# Patient Record
Sex: Female | Born: 1965 | Race: White | Hispanic: No | Smoking: Current every day smoker
Health system: Southern US, Community
[De-identification: ages and names within clinical notes are randomized; demographics above are authoritative.]

## PROBLEM LIST (undated history)

## (undated) DIAGNOSIS — C3490 Malignant neoplasm of unspecified part of unspecified bronchus or lung: Secondary | ICD-10-CM

## (undated) DIAGNOSIS — F319 Bipolar disorder, unspecified: Secondary | ICD-10-CM

## (undated) HISTORY — PX: LYMPH NODE DISSECTION: SHX5087

## (undated) HISTORY — PX: ABDOMINAL HYSTERECTOMY: SHX81

## (undated) HISTORY — PX: LUNG SURGERY: SHX703

---

## 1998-12-21 ENCOUNTER — Inpatient Hospital Stay (HOSPITAL_COMMUNITY): Admission: EM | Admit: 1998-12-21 | Discharge: 1999-01-01 | Payer: Self-pay | Admitting: Emergency Medicine

## 1999-08-25 ENCOUNTER — Emergency Department (HOSPITAL_COMMUNITY): Admission: EM | Admit: 1999-08-25 | Discharge: 1999-08-25 | Payer: Self-pay | Admitting: Emergency Medicine

## 1999-08-25 ENCOUNTER — Encounter: Payer: Self-pay | Admitting: Emergency Medicine

## 2000-04-08 ENCOUNTER — Encounter: Payer: Self-pay | Admitting: Emergency Medicine

## 2000-04-08 ENCOUNTER — Emergency Department (HOSPITAL_COMMUNITY): Admission: EM | Admit: 2000-04-08 | Discharge: 2000-04-09 | Payer: Self-pay | Admitting: Emergency Medicine

## 2000-04-09 ENCOUNTER — Encounter: Payer: Self-pay | Admitting: Emergency Medicine

## 2000-08-29 ENCOUNTER — Emergency Department (HOSPITAL_COMMUNITY): Admission: EM | Admit: 2000-08-29 | Discharge: 2000-08-29 | Payer: Self-pay | Admitting: Emergency Medicine

## 2000-08-31 ENCOUNTER — Emergency Department (HOSPITAL_COMMUNITY): Admission: EM | Admit: 2000-08-31 | Discharge: 2000-08-31 | Payer: Self-pay | Admitting: Emergency Medicine

## 2000-09-19 ENCOUNTER — Inpatient Hospital Stay (HOSPITAL_COMMUNITY): Admission: EM | Admit: 2000-09-19 | Discharge: 2000-09-22 | Payer: Self-pay

## 2000-09-19 ENCOUNTER — Encounter: Payer: Self-pay | Admitting: Internal Medicine

## 2000-11-24 ENCOUNTER — Encounter: Payer: Self-pay | Admitting: Internal Medicine

## 2000-11-24 ENCOUNTER — Inpatient Hospital Stay: Admission: EM | Admit: 2000-11-24 | Discharge: 2000-11-26 | Payer: Self-pay | Admitting: Emergency Medicine

## 2000-11-24 ENCOUNTER — Encounter: Payer: Self-pay | Admitting: Emergency Medicine

## 2000-11-30 ENCOUNTER — Emergency Department (HOSPITAL_COMMUNITY): Admission: EM | Admit: 2000-11-30 | Discharge: 2000-11-30 | Payer: Self-pay | Admitting: Emergency Medicine

## 2001-08-14 ENCOUNTER — Emergency Department (HOSPITAL_COMMUNITY): Admission: EM | Admit: 2001-08-14 | Discharge: 2001-08-14 | Payer: Self-pay | Admitting: Emergency Medicine

## 2002-08-10 ENCOUNTER — Emergency Department (HOSPITAL_COMMUNITY): Admission: EM | Admit: 2002-08-10 | Discharge: 2002-08-10 | Payer: Self-pay | Admitting: *Deleted

## 2002-08-10 ENCOUNTER — Encounter: Payer: Self-pay | Admitting: Emergency Medicine

## 2002-08-26 ENCOUNTER — Emergency Department (HOSPITAL_COMMUNITY): Admission: EM | Admit: 2002-08-26 | Discharge: 2002-08-26 | Payer: Self-pay | Admitting: Emergency Medicine

## 2002-10-23 ENCOUNTER — Emergency Department (HOSPITAL_COMMUNITY): Admission: EM | Admit: 2002-10-23 | Discharge: 2002-10-23 | Payer: Self-pay | Admitting: Emergency Medicine

## 2002-11-04 ENCOUNTER — Emergency Department (HOSPITAL_COMMUNITY): Admission: EM | Admit: 2002-11-04 | Discharge: 2002-11-04 | Payer: Self-pay | Admitting: Emergency Medicine

## 2003-04-27 ENCOUNTER — Emergency Department (HOSPITAL_COMMUNITY): Admission: EM | Admit: 2003-04-27 | Discharge: 2003-04-27 | Payer: Self-pay | Admitting: Emergency Medicine

## 2003-05-26 ENCOUNTER — Emergency Department (HOSPITAL_COMMUNITY): Admission: EM | Admit: 2003-05-26 | Discharge: 2003-05-26 | Payer: Self-pay | Admitting: Emergency Medicine

## 2003-05-26 ENCOUNTER — Encounter: Payer: Self-pay | Admitting: Emergency Medicine

## 2003-06-25 ENCOUNTER — Emergency Department (HOSPITAL_COMMUNITY): Admission: EM | Admit: 2003-06-25 | Discharge: 2003-06-25 | Payer: Self-pay | Admitting: Emergency Medicine

## 2003-06-25 ENCOUNTER — Encounter: Payer: Self-pay | Admitting: Emergency Medicine

## 2003-07-01 ENCOUNTER — Emergency Department (HOSPITAL_COMMUNITY): Admission: EM | Admit: 2003-07-01 | Discharge: 2003-07-01 | Payer: Self-pay | Admitting: Emergency Medicine

## 2003-10-21 ENCOUNTER — Emergency Department (HOSPITAL_COMMUNITY): Admission: EM | Admit: 2003-10-21 | Discharge: 2003-10-21 | Payer: Self-pay | Admitting: *Deleted

## 2003-12-03 ENCOUNTER — Emergency Department (HOSPITAL_COMMUNITY): Admission: EM | Admit: 2003-12-03 | Discharge: 2003-12-04 | Payer: Self-pay | Admitting: Emergency Medicine

## 2003-12-05 ENCOUNTER — Emergency Department (HOSPITAL_COMMUNITY): Admission: EM | Admit: 2003-12-05 | Discharge: 2003-12-05 | Payer: Self-pay | Admitting: Emergency Medicine

## 2004-01-04 ENCOUNTER — Emergency Department (HOSPITAL_COMMUNITY): Admission: EM | Admit: 2004-01-04 | Discharge: 2004-01-04 | Payer: Self-pay | Admitting: Emergency Medicine

## 2004-01-30 ENCOUNTER — Emergency Department (HOSPITAL_COMMUNITY): Admission: EM | Admit: 2004-01-30 | Discharge: 2004-01-30 | Payer: Self-pay | Admitting: Emergency Medicine

## 2004-02-06 ENCOUNTER — Emergency Department (HOSPITAL_COMMUNITY): Admission: EM | Admit: 2004-02-06 | Discharge: 2004-02-06 | Payer: Self-pay | Admitting: Emergency Medicine

## 2004-03-08 ENCOUNTER — Emergency Department (HOSPITAL_COMMUNITY): Admission: EM | Admit: 2004-03-08 | Discharge: 2004-03-08 | Payer: Self-pay | Admitting: Emergency Medicine

## 2004-04-09 ENCOUNTER — Emergency Department (HOSPITAL_COMMUNITY): Admission: EM | Admit: 2004-04-09 | Discharge: 2004-04-09 | Payer: Self-pay | Admitting: Emergency Medicine

## 2004-05-01 ENCOUNTER — Emergency Department (HOSPITAL_COMMUNITY): Admission: EM | Admit: 2004-05-01 | Discharge: 2004-05-01 | Payer: Self-pay | Admitting: Emergency Medicine

## 2004-07-04 ENCOUNTER — Emergency Department (HOSPITAL_COMMUNITY): Admission: EM | Admit: 2004-07-04 | Discharge: 2004-07-04 | Payer: Self-pay | Admitting: Emergency Medicine

## 2004-07-16 ENCOUNTER — Emergency Department (HOSPITAL_COMMUNITY): Admission: EM | Admit: 2004-07-16 | Discharge: 2004-07-16 | Payer: Self-pay | Admitting: Emergency Medicine

## 2004-09-20 ENCOUNTER — Ambulatory Visit: Payer: Self-pay | Admitting: Psychiatry

## 2004-09-21 ENCOUNTER — Inpatient Hospital Stay (HOSPITAL_COMMUNITY): Admission: RE | Admit: 2004-09-21 | Discharge: 2004-09-29 | Payer: Self-pay | Admitting: Psychiatry

## 2004-10-06 ENCOUNTER — Inpatient Hospital Stay (HOSPITAL_COMMUNITY): Admission: EM | Admit: 2004-10-06 | Discharge: 2004-10-08 | Payer: Self-pay | Admitting: General Surgery

## 2005-02-19 ENCOUNTER — Emergency Department (HOSPITAL_COMMUNITY): Admission: EM | Admit: 2005-02-19 | Discharge: 2005-02-19 | Payer: Self-pay | Admitting: Emergency Medicine

## 2005-05-31 ENCOUNTER — Emergency Department (HOSPITAL_COMMUNITY): Admission: EM | Admit: 2005-05-31 | Discharge: 2005-05-31 | Payer: Self-pay | Admitting: Emergency Medicine

## 2005-07-30 ENCOUNTER — Emergency Department (HOSPITAL_COMMUNITY): Admission: EM | Admit: 2005-07-30 | Discharge: 2005-07-30 | Payer: Self-pay | Admitting: Emergency Medicine

## 2005-10-11 ENCOUNTER — Ambulatory Visit: Payer: Self-pay | Admitting: Psychiatry

## 2005-10-11 ENCOUNTER — Inpatient Hospital Stay (HOSPITAL_COMMUNITY): Admission: EM | Admit: 2005-10-11 | Discharge: 2005-10-22 | Payer: Self-pay | Admitting: Psychiatry

## 2006-07-10 ENCOUNTER — Emergency Department (HOSPITAL_COMMUNITY): Admission: EM | Admit: 2006-07-10 | Discharge: 2006-07-10 | Payer: Self-pay | Admitting: *Deleted

## 2006-09-04 ENCOUNTER — Ambulatory Visit (HOSPITAL_COMMUNITY): Admission: RE | Admit: 2006-09-04 | Discharge: 2006-09-04 | Payer: Self-pay | Admitting: Obstetrics and Gynecology

## 2006-10-05 ENCOUNTER — Emergency Department (HOSPITAL_COMMUNITY): Admission: EM | Admit: 2006-10-05 | Discharge: 2006-10-05 | Payer: Self-pay | Admitting: Emergency Medicine

## 2006-11-08 ENCOUNTER — Encounter (INDEPENDENT_AMBULATORY_CARE_PROVIDER_SITE_OTHER): Payer: Self-pay | Admitting: Specialist

## 2006-11-08 ENCOUNTER — Ambulatory Visit (HOSPITAL_COMMUNITY): Admission: RE | Admit: 2006-11-08 | Discharge: 2006-11-08 | Payer: Self-pay | Admitting: Obstetrics and Gynecology

## 2007-05-18 ENCOUNTER — Emergency Department (HOSPITAL_COMMUNITY): Admission: EM | Admit: 2007-05-18 | Discharge: 2007-05-18 | Payer: Self-pay | Admitting: Emergency Medicine

## 2007-12-11 ENCOUNTER — Emergency Department (HOSPITAL_COMMUNITY): Admission: EM | Admit: 2007-12-11 | Discharge: 2007-12-11 | Payer: Self-pay | Admitting: Emergency Medicine

## 2008-01-02 ENCOUNTER — Emergency Department (HOSPITAL_COMMUNITY): Admission: EM | Admit: 2008-01-02 | Discharge: 2008-01-02 | Payer: Self-pay | Admitting: Emergency Medicine

## 2009-03-13 ENCOUNTER — Emergency Department (HOSPITAL_COMMUNITY): Admission: EM | Admit: 2009-03-13 | Discharge: 2009-03-13 | Payer: Self-pay | Admitting: Emergency Medicine

## 2009-12-31 ENCOUNTER — Emergency Department (HOSPITAL_COMMUNITY): Admission: EM | Admit: 2009-12-31 | Discharge: 2009-12-31 | Payer: Self-pay | Admitting: Emergency Medicine

## 2010-01-12 ENCOUNTER — Emergency Department (HOSPITAL_COMMUNITY): Admission: EM | Admit: 2010-01-12 | Discharge: 2010-01-12 | Payer: Self-pay | Admitting: Emergency Medicine

## 2010-03-10 ENCOUNTER — Emergency Department (HOSPITAL_COMMUNITY): Admission: EM | Admit: 2010-03-10 | Discharge: 2010-03-10 | Payer: Self-pay | Admitting: Emergency Medicine

## 2010-04-09 ENCOUNTER — Emergency Department (HOSPITAL_BASED_OUTPATIENT_CLINIC_OR_DEPARTMENT_OTHER): Admission: EM | Admit: 2010-04-09 | Discharge: 2010-04-09 | Payer: Self-pay | Admitting: Emergency Medicine

## 2010-04-09 ENCOUNTER — Ambulatory Visit: Payer: Self-pay | Admitting: Diagnostic Radiology

## 2010-04-12 ENCOUNTER — Emergency Department (HOSPITAL_COMMUNITY): Admission: EM | Admit: 2010-04-12 | Discharge: 2010-04-12 | Payer: Self-pay | Admitting: Emergency Medicine

## 2010-07-20 ENCOUNTER — Emergency Department (HOSPITAL_BASED_OUTPATIENT_CLINIC_OR_DEPARTMENT_OTHER): Admission: EM | Admit: 2010-07-20 | Discharge: 2010-07-20 | Payer: Self-pay | Admitting: Emergency Medicine

## 2010-08-14 ENCOUNTER — Emergency Department (HOSPITAL_BASED_OUTPATIENT_CLINIC_OR_DEPARTMENT_OTHER): Admission: EM | Admit: 2010-08-14 | Discharge: 2010-08-14 | Payer: Self-pay | Admitting: Emergency Medicine

## 2010-08-14 ENCOUNTER — Ambulatory Visit: Payer: Self-pay | Admitting: Psychiatry

## 2010-08-14 ENCOUNTER — Inpatient Hospital Stay (HOSPITAL_COMMUNITY): Admission: RE | Admit: 2010-08-14 | Discharge: 2010-08-23 | Payer: Self-pay | Admitting: Psychiatry

## 2010-09-13 ENCOUNTER — Emergency Department (HOSPITAL_COMMUNITY): Admission: EM | Admit: 2010-09-13 | Discharge: 2010-09-19 | Payer: Self-pay | Admitting: Emergency Medicine

## 2010-09-14 DIAGNOSIS — IMO0002 Reserved for concepts with insufficient information to code with codable children: Secondary | ICD-10-CM

## 2010-09-16 DIAGNOSIS — F1910 Other psychoactive substance abuse, uncomplicated: Secondary | ICD-10-CM

## 2010-09-18 DIAGNOSIS — F331 Major depressive disorder, recurrent, moderate: Secondary | ICD-10-CM

## 2010-09-19 ENCOUNTER — Ambulatory Visit: Payer: Self-pay | Admitting: Psychiatry

## 2010-10-14 ENCOUNTER — Inpatient Hospital Stay (HOSPITAL_COMMUNITY): Admission: AD | Admit: 2010-10-14 | Discharge: 2010-09-28 | Payer: Self-pay | Admitting: Psychiatry

## 2010-11-24 ENCOUNTER — Emergency Department (HOSPITAL_BASED_OUTPATIENT_CLINIC_OR_DEPARTMENT_OTHER)
Admission: EM | Admit: 2010-11-24 | Discharge: 2010-11-24 | Payer: Self-pay | Source: Home / Self Care | Admitting: Emergency Medicine

## 2010-11-25 ENCOUNTER — Emergency Department (HOSPITAL_COMMUNITY)
Admission: EM | Admit: 2010-11-25 | Discharge: 2010-11-26 | Disposition: A | Discharge status: Other Acute Inpatient Hospital | Payer: Self-pay | Source: Home / Self Care | Admitting: Emergency Medicine

## 2010-11-26 ENCOUNTER — Inpatient Hospital Stay (HOSPITAL_COMMUNITY)
Admission: AD | Admit: 2010-11-26 | Discharge: 2010-12-03 | Payer: Self-pay | Source: Home / Self Care | Attending: Psychiatry | Admitting: Psychiatry

## 2010-11-27 ENCOUNTER — Encounter: Payer: Self-pay | Admitting: Obstetrics and Gynecology

## 2010-11-29 LAB — COMPREHENSIVE METABOLIC PANEL
ALT: 26 U/L (ref 0–35)
ALT: 20 U/L (ref 0–35)
Albumin: 3.9 g/dL (ref 3.5–5.2)
BUN: 20 mg/dL (ref 6–23)
BUN: 8 mg/dL (ref 6–23)
CO2: 24 mEq/L (ref 19–32)
Calcium: 9.4 mg/dL (ref 8.4–10.5)
Calcium: 9.2 mg/dL (ref 8.4–10.5)
Creatinine, Ser: 0.85 mg/dL (ref 0.4–1.2)
GFR calc non Af Amer: >60 mL/min (ref >60)
Glucose, Bld: 90 mg/dL (ref 70–99)
Glucose, Bld: 92 mg/dL (ref 70–99)
Potassium: 4.5 mEq/L (ref 3.5–5.1)
Sodium: 143 mEq/L (ref 135–145)
Sodium: 137 mEq/L (ref 135–145)
Total Protein: 7.4 g/dL (ref 6.0–8.3)

## 2010-11-29 LAB — DIFFERENTIAL
Basophils Absolute: 0 10*3/uL (ref 0.0–0.1)
Basophils Relative: 0 % (ref 0–1)
Eosinophils Absolute: 0.2 10*3/uL (ref 0.0–0.7)
Eosinophils Relative: 2 % (ref 0–5)
Eosinophils Relative: 1 % (ref 0–5)
Lymphocytes Relative: 34 % (ref 12–46)
Lymphocytes Relative: 37 % (ref 12–46)
Lymphs Abs: 3.3 10*3/uL (ref 0.7–4.0)
Monocytes Absolute: 0.9 10*3/uL (ref 0.1–1.0)
Monocytes Absolute: 0.6 10*3/uL (ref 0.1–1.0)
Monocytes Relative: 7 % (ref 3–12)

## 2010-11-29 LAB — CBC
HCT: 42.1 % (ref 36.0–46.0)
HCT: 46.4 % — ABNORMAL HIGH (ref 36.0–46.0)
MCH: 32.3 pg (ref 26.0–34.0)
MCHC: 34.9 g/dL (ref 30.0–36.0)
MCHC: 34.7 g/dL (ref 30.0–36.0)
MCV: 93 fL (ref 78.0–100.0)
Platelets: 280 10*3/uL (ref 150–400)
RDW: 12.7 % (ref 11.5–15.5)
RDW: 12.7 % (ref 11.5–15.5)
WBC: 9.8 10*3/uL (ref 4.0–10.5)

## 2010-11-29 LAB — URINALYSIS, ROUTINE W REFLEX MICROSCOPIC
Bilirubin Urine: NEGATIVE
Bilirubin Urine: NEGATIVE
Hgb urine dipstick: NEGATIVE
Ketones, ur: NEGATIVE mg/dL
Ketones, ur: NEGATIVE mg/dL
Nitrite: NEGATIVE
Protein, ur: NEGATIVE mg/dL
Protein, ur: NEGATIVE mg/dL
Specific Gravity, Urine: 1.005 (ref 1.005–1.030)
Urine Glucose, Fasting: NEGATIVE mg/dL
Urine Glucose, Fasting: NEGATIVE mg/dL
Urobilinogen, UA: 0.2 mg/dL (ref 0.0–1.0)
pH: 5.5 (ref 5.0–8.0)

## 2010-11-29 LAB — ETHANOL: Alcohol, Ethyl (B): <5 mg/dL (ref 0–10)

## 2010-11-29 LAB — RAPID URINE DRUG SCREEN, HOSP PERFORMED
Benzodiazepines: POSITIVE — AB
Cocaine: NOT DETECTED
Opiates: POSITIVE — AB

## 2010-11-29 LAB — PREGNANCY, URINE: Preg Test, Ur: NEGATIVE

## 2010-11-29 LAB — LIPASE, BLOOD: Lipase: 144 U/L (ref 23–300)

## 2010-11-29 LAB — POCT PREGNANCY, URINE: Preg Test, Ur: NEGATIVE

## 2010-11-29 LAB — SALICYLATE LEVEL: Salicylate Lvl: <4 mg/dL (ref 2.8–20.0)
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
## 2011-01-18 LAB — BASIC METABOLIC PANEL
CO2: 25 mEq/L (ref 19–32)
Calcium: 9.7 mg/dL (ref 8.4–10.5)
GFR calc Af Amer: >60 mL/min (ref >60)
GFR calc non Af Amer: >60 mL/min (ref >60)
Sodium: 138 mEq/L (ref 135–145)

## 2011-01-18 LAB — DIFFERENTIAL
Lymphs Abs: 2.9 10*3/uL (ref 0.7–4.0)
Monocytes Relative: 6 % (ref 3–12)
Neutro Abs: 6.9 10*3/uL (ref 1.7–7.7)
Neutrophils Relative %: 66 % (ref 43–77)

## 2011-01-18 LAB — RAPID URINE DRUG SCREEN, HOSP PERFORMED
Opiates: NOT DETECTED
Tetrahydrocannabinol: NOT DETECTED

## 2011-01-18 LAB — CBC
Hemoglobin: 16.5 g/dL — ABNORMAL HIGH (ref 12.0–15.0)
RBC: 5.09 MIL/uL (ref 3.87–5.11)

## 2011-01-18 LAB — TRICYCLICS SCREEN, URINE: TCA Scrn: POSITIVE — AB

## 2011-01-18 LAB — POTASSIUM: Potassium: 3.6 mEq/L (ref 3.5–5.1)

## 2011-01-19 LAB — BASIC METABOLIC PANEL
Chloride: 102 mEq/L (ref 96–112)
GFR calc Af Amer: >60 mL/min (ref >60)
Potassium: 4 mEq/L (ref 3.5–5.1)

## 2011-01-20 LAB — CBC
Hemoglobin: 14.2 g/dL (ref 12.0–15.0)
MCHC: 33.6 g/dL (ref 30.0–36.0)
Platelets: 251 10*3/uL (ref 150–400)
RBC: 4.45 MIL/uL (ref 3.87–5.11)

## 2011-01-20 LAB — POCT TOXICOLOGY PANEL
Amphetamines: POSITIVE
Benzodiazepines: POSITIVE
Cocaine: POSITIVE

## 2011-01-20 LAB — COMPREHENSIVE METABOLIC PANEL
Albumin: 4 g/dL (ref 3.5–5.2)
Alkaline Phosphatase: 97 U/L (ref 39–117)
BUN: 11 mg/dL (ref 6–23)
Calcium: 9.1 mg/dL (ref 8.4–10.5)
Glucose, Bld: 108 mg/dL — ABNORMAL HIGH (ref 70–99)
Potassium: 3.9 mEq/L (ref 3.5–5.1)
Total Protein: 7.7 g/dL (ref 6.0–8.3)

## 2011-01-20 LAB — DIFFERENTIAL
Basophils Relative: 0 % (ref 0–1)
Lymphocytes Relative: 35 % (ref 12–46)
Lymphs Abs: 2.4 10*3/uL (ref 0.7–4.0)
Monocytes Absolute: 0.5 10*3/uL (ref 0.1–1.0)
Monocytes Relative: 8 % (ref 3–12)
Neutro Abs: 3.8 10*3/uL (ref 1.7–7.7)
Neutrophils Relative %: 53 % (ref 43–77)

## 2011-01-20 LAB — URINALYSIS, ROUTINE W REFLEX MICROSCOPIC
Glucose, UA: NEGATIVE mg/dL
Protein, ur: NEGATIVE mg/dL
pH: 5 (ref 5.0–8.0)

## 2011-01-20 LAB — ETHANOL: Alcohol, Ethyl (B): <5 mg/dL (ref 0–10)

## 2011-01-20 LAB — POCT CARDIAC MARKERS: Myoglobin, poc: 65.6 ng/mL (ref 12–200)

## 2011-01-24 LAB — URINE MICROSCOPIC-ADD ON

## 2011-01-24 LAB — URINALYSIS, ROUTINE W REFLEX MICROSCOPIC
Glucose, UA: NEGATIVE mg/dL
Glucose, UA: NEGATIVE mg/dL
Leukocytes, UA: NEGATIVE
Protein, ur: NEGATIVE mg/dL
Protein, ur: 30 mg/dL — AB
Specific Gravity, Urine: 1.018 (ref 1.005–1.030)
Specific Gravity, Urine: 1.021 (ref 1.005–1.030)
Urobilinogen, UA: 1 mg/dL (ref 0.0–1.0)

## 2011-01-24 LAB — POCT TOXICOLOGY PANEL: Amphetamines: POSITIVE

## 2011-01-24 LAB — URINE CULTURE
Colony Count: NO GROWTH
Colony Count: NO GROWTH
Culture: NO GROWTH
Culture: NO GROWTH

## 2011-01-25 LAB — URINE MICROSCOPIC-ADD ON

## 2011-01-25 LAB — RAPID URINE DRUG SCREEN, HOSP PERFORMED
Barbiturates: NOT DETECTED
Opiates: NOT DETECTED
Tetrahydrocannabinol: POSITIVE — AB

## 2011-01-25 LAB — URINALYSIS, ROUTINE W REFLEX MICROSCOPIC
Glucose, UA: NEGATIVE mg/dL
Leukocytes, UA: NEGATIVE
Protein, ur: NEGATIVE mg/dL
Specific Gravity, Urine: 1.021 (ref 1.005–1.030)
pH: 7 (ref 5.0–8.0)

## 2011-01-25 LAB — COMPREHENSIVE METABOLIC PANEL
Albumin: 3.6 g/dL (ref 3.5–5.2)
Alkaline Phosphatase: 71 U/L (ref 39–117)
BUN: 7 mg/dL (ref 6–23)
CO2: 30 mEq/L (ref 19–32)
Chloride: 102 mEq/L (ref 96–112)
Potassium: 3.1 mEq/L — ABNORMAL LOW (ref 3.5–5.1)
Total Bilirubin: 0.7 mg/dL (ref 0.3–1.2)

## 2011-02-15 LAB — URINALYSIS, ROUTINE W REFLEX MICROSCOPIC
Glucose, UA: NEGATIVE mg/dL
Hgb urine dipstick: NEGATIVE
Ketones, ur: >80 mg/dL — AB
Protein, ur: NEGATIVE mg/dL

## 2011-02-15 LAB — CBC
HCT: 47.5 % — ABNORMAL HIGH (ref 36.0–46.0)
MCV: 94.5 fL (ref 78.0–100.0)
Platelets: 273 10*3/uL (ref 150–400)
RDW: 12.6 % (ref 11.5–15.5)

## 2011-02-15 LAB — COMPREHENSIVE METABOLIC PANEL
Albumin: 4 g/dL (ref 3.5–5.2)
BUN: 12 mg/dL (ref 6–23)
Calcium: 9.4 mg/dL (ref 8.4–10.5)
Creatinine, Ser: 0.85 mg/dL (ref 0.4–1.2)
Total Protein: 7.5 g/dL (ref 6.0–8.3)

## 2011-02-15 LAB — DIFFERENTIAL
Lymphocytes Relative: 9 % — ABNORMAL LOW (ref 12–46)
Monocytes Absolute: 0.7 10*3/uL (ref 0.1–1.0)
Monocytes Relative: 5 % (ref 3–12)
Neutro Abs: 13.7 10*3/uL — ABNORMAL HIGH (ref 1.7–7.7)
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
## 2011-03-25 NOTE — Discharge Summary (Signed)
NAMESHARILYNN, [name]           ACCOUNT NO.:  [id number]   MEDICAL RECORD NO.:  [id number]          PATIENT TYPE:  INP   LOCATION:  0379                         FACILITY[facility]   PHYSICIAN:  Sharlet Salina T. Hoxworth, M.D.DATE OF BIRTH:  1966-01-11   DATE OF ADMISSION:  10/06/2004  DATE OF DISCHARGE:  10/08/2004                                 DISCHARGE SUMMARY   DISCHARGE DIAGNOSES:  Complex abscess posterior neck.   SURGICAL PROCEDURE:  I&D and debridement complex abscess posterior neck  October 06, 2004.   HISTORY OF PRESENT ILLNESS:  The patient is a 45 year old female with an  approximately 5-6 day history of worsening area of pain, redness and  swelling on her posterior neck.  She was evaluated in our office today after  referral and then admitted for further surgical treatment.  No history of  similar illnesses.   PAST MEDICAL HISTORY:  Surgery includes TAH, breast biopsy, bilateral tubal  ligation.  She is treated for anxiety and depression.   MEDICATIONS:  Tegretol, Cymbalta, Neurontin, Klonopin, Protonix.   ALLERGIES:  She is allergic to PENICILLIN and TORADOL.   Social history, family history and review of systems see admission H&P.   PHYSICAL EXAMINATION:  VITAL SIGNS:  Temperature is 101.5, pulse 98.  Remainder of vital signs normal.   Pertinent exam was limited to soft tissue of the neck which showed a large  approximately 6-7 cm area of induration, erythema and swelling in the  posterior neck with some central skin necrosis.   HOSPITAL COURSE:  The patient was admitted. She was started empirically on  IV vancomycin. She underwent incision, drainage and debridement of a large  complex abscess in the posterior neck.  The first postoperative day, her  white count was 14,000. She still had low grade fever. She was treated with  frequent moist saline dressing changes.  Gram stain revealed Gram positive  cocci in clusters. By December 2, she was afebrile, felt  significantly  better.  Her wound was cleaning up nicely. She was discharged at this time.  Cultures are pending.  She was discharged on p.o. doxicycline 100 mg twice  daily for the next week.  Followup is to be in our office in 2-3 days.  Home  health will assist with wound care.     Benj   BTH/MEDQ  D:  10/26/2004  T:  10/27/2004  Job:  [id number]

## 2011-03-25 NOTE — Discharge Summary (Signed)
[facility]  Patient:    [name], [name]                  MRN: [id number] Adm. Date:  [id number] Disc. Date: [id number] Attending:  Sharlet Salina CC:         Delton Coombes, M.D. [facility] Dept of Mental Health [address] [place], [place] [address]             [address]  [facility]   Discharge Summary  FINAL DIAGNOSES: 1. Dystonic reaction secondary to Paxil. 2. TMJ syndrome. 3. Bronchitis. 4. Gingivitis. 5. Bipolar disorder. 6. Anxiety disorder. 7. History of crack addiction.  HISTORY OF PRESENT ILLNESS:  This 45 year old white female presented to [facility] Emergency Department on the morning of November 24, 2000, via ambulance, with history of having onset the evening prior of episodes in which her jaws would actually lock down, and she would have some problems swallowing with inability to speak.  The patient was complaining bitterly of right ear pain.  She has had a recent URI, and is bringing up discolored sputum.  She is a heavy smoker.  Apparently, she had a couple of episodes, one on the evening of November 23, 2000, and again on the morning of November 24, 2000.  She has a long-standing history of bipolar disorder and anxiety disorder, and is maintained on Neurontin 900 mg daily and Paxil 40 mg daily per physicians at mental health.  She also has a history of crack addiction, and was kicked out of the [facility] approximately two weeks ago for relapsing.  She is now back at [facility].  Her drug screen here was positive only for benzodiazepine Clonopin) and morphine sulfate (opiate).  She had received morphine in the emergency department for jaw pain.  She did actually have an episode in the emergency department late morning in which she had basically a full blown dystonic reaction with her neck arching backwards and jaws clenching down.  She salivated.  She really was unable to speak, and this episode lasted some 20 to 30  minutes.  It was treated with Toradol IV and IV Benadryl by emergency department physician.  Subsequently, she received several doses of morphine sulfate for pain.  She had no evidence of ear infection.  I think the ear pain is clearly related to a sore right TM joint. A panorex view was obtained of the TM joint by the emergency department physician and it was unremarkable.  I obtained a chest x-ray showing what is believed to be some atelectasis and no frank infiltrate.  She did have a slight elevation of SGPT which in retrospect could have been due to a muscle component with enzyme released into the blood stream with this dystonic reaction.  Several labs are pending at the time of discharge, including a hepatitis panel.  She has a history of a "liver infection" many years ago, it is not clear to me what the exact diagnosis was.  She denies IV drug abuse. She has been hospitalized a couple of times at [facility], most recently in November 2001, after a suicidal gesture.  She had cocaine in her system at that time according to records at [facility].  She says that she has no transportation and no money to buy her medications.  She recently started living at [facility], and apparently has to get a job immediately in order to pay the $85.00 a week required for her to stay there.  Formerly worked at [employer] until she had a relapse of crack addiction.  In any case, she had no further recurrence of full blown dystonic reactions while in the hospital.  The day following admission she was noted to be doing pretty well, but complained of feeling "ancy."  I think she received an additional dose of Clonopin per on call physician, but basically explained to her that we were switching her from Paxil to Celexa and that she might feel some lack of sedation because of that.  We have an extensive taper for her to do gradually moving off of Paxil and on to Celexa over the next week.  We have arranged  for her to be seen at [facility] on Tuesday at 9 a.m. at [facility].  She was encouraged to follow up with [facility] for her medical care, and to contact [facility] to see if she qualifies for any type of Medicaid to cover this hospitalization.  I have obtained from my office samples of Levaquin 500 mg q.d. x 7 days to treat her bronchitis, Advair inhaler 250/50 2 sprays p.o. q.12h. x 2 weeks.  She has a Ventolin inhaler which was ordered here at the hospital, 2 sprays p.o. q.i.d. p.r.n.  She is to continue with Neurontin 900 mg at bedtime, Clonopin 1 mg q.i.d.  She will be tapered off of Paxil as follows:  On November 27, 2000, she will receive 20 mg of Paxil, on Tuesday, 10 mg of Paxil, Wednesday, November 29, 2000, 10 mg of Paxil, Thursday, November 30, 2000, 10 mg of Paxil, and Friday, December 01, 2000, no Paxil.  Celexa will be given as follows:  Monday, November 27, 2000, she will receive 20 mg of Celexa, on Tuesday, November 28, 2000, 30 mg of Celexa, Wednesday, November 29, 2000, 30 mg of Celexa which will be continued until Friday, December 01, 2000, when she will increase to 40 mg of Celexa.  I have also provided Celexa samples for one week from my office.  She also asked for samples or hospital provided ibuprofen for the TM joint pain.  She was told by hospital staff that none of that was available for take home.  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  So, she does have refills on Clonopin prescription. Suggested that she try to find money to buy just a few doses to last until Tuesday when she could be seen at mental health.  She does have money for cigarettes apparently, or at least someone provides cigarettes for her, and she smokes heavily a pack a day.  She was advised to take Extra Strength Tylenol in between doses of ibuprofen for the TM joint pain, and to use a moist heating pad or  hot washcloth to the right TM joint.  Explained to her  that this should get better over the next week or so, and not to eat any foods such as steak, which would aggravate the TM joint, and not to chew gum. Explained to her that TM joint pain was usually very manageable and was basically an aggravation, and not expected to be a significant problem. Yesterday she had asked about an injection of Ativan because she was ancy, and I told her that I did not think that was appropriate.  She exhibits some manipulative type behavior and some questionable drug seeking behavior.  She does have a history of prescription drug abuse in  the past, in addition to her crack habit.  For other details of her history, please see dictated history and physical.  Therefore, the patient is being discharged today in stable condition accompanied by her sister.  The patient says she plans to return immediately to [facility]. DD:  11/26/00 TD:  11/27/00 Job: [id number]

## 2011-03-25 NOTE — Discharge Summary (Signed)
[place]  Patient:    [name], [name]                  MRN: [id number] Adm. Date:  [id number] Disc. Date: [id number] Attending:  Edwyna Perfect Dictator:   Felton Clinton, M.D. CC:         Adelene Amas. Hall, M.D., [place], [place]   Discharge Summary  DISCHARGE DIAGNOSES: 1. Drug overdose on Neurontin and Klonopin.  Apparent suicide attempt. 2. Bipolar disorder/depression. 3. Polysubstance abuse including alcohol and cocaine. 4. Anxiety disorder.  DISCHARGE MEDICATIONS: 1. Neurontin 300 mg p.o. t.i.d. 2. Paxil 40 mg p.o. q.d.  DISCHARGE FOLLOWUP:  Arrangements were made for [name] to be transferred to the [facility].  She will be treated there for her cocaine and alcohol abuse.  In addition, arrangements will be made for her to be detoxed from her chronic benzodiazepine use.  Upon her discharge from [facility] she will follow up with Dr. Gwyndolyn Kaufman at [facility].  CONSULTATIONS:  Dr. Jeanie Sewer, psychiatry.  PROCEDURES:  None.  HISTORY OF PRESENT ILLNESS:  [name] is a 45 year old white female with bipolar disorder diagnosed in 1995; polysubstance abuse including benzodiazepine, crack cocaine and OxyContin; and anxiety disorder who presents to the emergency department secondary to an intentional drug overdose.  This overdose consisted of 30 Neurontin tablets, 10 Klonopin tablets, and one Paxil taken at approximately 7:30 in the morning of admission.  The patient states that the overdose was intentional and had been planned.  She had called relatives including her 61 and 63 year old daughters to say good-bye.  She has recently completed an eight-week rehabilitation as an outpatient in [place] for alcohol abuse.  She recently moved back to [place] and has been living here with her sister.  Prior to her suicide attempt, her sister informed the patient that she would have to move out  of her current living arrangements, which precipitated this crisis.  She has been seeing Dr. Vilinda Blanks at [facility] for approximately one month.  ADMISSION LABORATORY DATA:  White blood cell count 8.9, hemoglobin 14.3, platelets 337, MCV 94.2.  Sodium 139, potassium 3.7, chloride 108, bicarb 29, BUN 10, creatinine 0.8, glucose 87, calcium 8.4, total bilirubin, 0.2, total protein 6.9, albumin 3.3, AST 16, ALT 13, alkaline phosphatase 83.  Urinalysis showed specific gravity of 1.009, small leukocyte esterase, white blood cells 21-50.  Urine drug screen was positive for benzodiazepines and cocaine.  HOSPITAL COURSE: #1 - BIPOLAR DISORDER/DEPRESSION:  [name] was stable upon her admission to the hospital and was admitted to a regular bed.  A sitter was provided to be with the patient at all times.  She remained somewhat agitated throughout her hospital stay, requiring IV Haldol for sedation several times.  Her Neurontin and Paxil were initially held secondary to her overdose, but were restarted on the day prior to discharge.  A psychiatry consult with Dr. Jeanie Sewer was obtained.  Dr. Jeanie Sewer recommended inpatient psychiatric treatment for intensive therapy.  Initially, [name] declined.  Placement was a problem as she had no insurance.  Arrangements were made for her to be transferred to [facility], and she seemed comfortable with this situation.  Given her chronic history of benzodiazepine use, it was felt that a supervised detox center would be an appropriate place for disposition.  #2 - URINARY TRACT INFECTION:  [name] was found to have a urinary tract infection on admission.  She was  treated with Cipro.  DISCHARGE LABORATORIES:  White blood cell count 9.4, hemoglobin 13.6, platelets 320, sodium 137, potassium 3.5, chloride 107, bicarb 25, glucose 78, BUN 8, creatinine 0.6, calcium 8.4. DD:  10/19/00 TD:  10/20/00 Job: [id number]

## 2011-03-25 NOTE — H&P (Signed)
NAME:  [name], [name]           ACCOUNT NO.:  [id number]   MEDICAL RECORD NO.:  [id number]          PATIENT TYPE:  AMB   LOCATION:  SDC                           FACILITY:  [facility]   PHYSICIAN:  Sherron Monday, MD        DATE OF BIRTH:  [birth year]   DATE OF ADMISSION:  DATE OF DISCHARGE:                              HISTORY & PHYSICAL   She will be undergoing an operative laparoscopy, possible fulguration of  endometriosis, bilateral salpingectomy, unilateral and possible  bilateral oophorectomy.   DIAGNOSIS:  Pelvic pain, questionable endometriosis.   HISTORY OF PRESENT ILLNESS:  [name] is a 45 year old G3, P2-0-1-2  who presents initially in October with a right ovarian cyst that she  states is very painful.  She describes it as 9/10 and constant, right-  sided pain.  The right ovarian cyst was measured 4.7 x 4.2 x 4.5 on  September 3.  She has had minimal vaginal bleeding for the last several  days prior to her being seen in the office.  She also complained of a  cough and a low-grade temperature.   PAST MEDICAL HISTORY:  Significant for bipolar disorder, narcotic  addiction.   PAST SURGICAL HISTORY:  Significant for bilateral tubal ligation,  vaginal hysterectomy, breast lumpectomy, cyst removal from her neck and  back.   PAST OBSTETRICAL AND GYNECOLOGICAL HISTORY:  She is a G3, P2-0-1-2 with  a miscarriage with a D&C and two term vaginal deliveries of female  infants without complications.  She states she has had an abnormal Pap  in the past; they have been normal since then.  Her last was December  2006.  She has a history remotely of chlamydia.  Her menarche was at age  59.  She always had irregular, heavy cycles and lots of pain.  She says  she is sexually active with her ex-husband and has had dyspareunia since  this diagnosis of the cyst.  She does have some history of occasional  postcoital bleeding prior to her hysterectomy.   MEDICATIONS:  Include Percocet and  Xanax at the time she was originally  seen.  Currently she states she is taking Suboxone.   ALLERGIES:  To PENICILLIN, TORADOL, and PAXIL which all caused itching.   SOCIAL HISTORY:  She admits to smoking a half a pack of cigarettes a  day.  No alcohol or drugs.  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   FAMILY HISTORY:  Significant for hypertension in her mother,  hypercholesterolemia in the mother.  No coronary artery disease, no  diabetes.  Cancer in her mother which she says was colon, breast, and  skin cancer.   PHYSICAL EXAMINATION:  Transvaginal ultrasound revealed a small ovarian  cyst that was simple.  She was evaluated for a questionable history of  kidney stones and questionable exacerbation.  She had a formal pelvic  ultrasound which revealed a right ovarian cyst, as well as left ovarian  cyst which had a solid cyst, a hydrosalpinx as well.  Her cul-de-sac was  noted to be without fluid.  The formal ultrasound was performed after  a  small cyst was noted on her ovary by CT.   She presented to the office on numerous occasions or calling requesting  pain medication, which she was given naproxen and Ponstel.  She  requested on numerous occasions Vicodin and Darvocet.  This was  confirmed with her family practice doctor that she was frequently  requesting these medications.  After the ultrasound revealed a solid  cyst on her ovary a CA-125 was checked and found to be 13.2.  We then  discussed with the patient the operative laparoscopy, fulguration of  endometriosis, unilateral or bilateral oophorectomy, bilateral  salpingectomy, and the patient voiced understanding and wished to  proceed.  She was also given a narcotic contract and #15 of Vicodin.  The patient presented for her preoperative appointment and gave the name  of her psychiatrist who she stated she is now on Suboxone from and  wanted [place] to call for pain plan with him.  There has been a message left  with him.  We  will proceed with surgery.   ASSESSMENT AND PLAN:  A 45 year old gravida 3, para 2 with bilateral  hydrosalpinx, solid-appearing cyst on the left ovary, as well as pelvic  pain.  She will have an operative laparoscopy, fulguration of  endometriosis, bilateral salpingectomy, unilateral versus bilateral  oophorectomy.  We will proceed with the surgery on November 08, 2006.      Sherron Monday, MD  Electronically Signed     JB/MEDQ  D:  11/06/2006  T:  11/06/2006  Job:  [id number]

## 2011-03-25 NOTE — Op Note (Signed)
[name], [name]           ACCOUNT NO.:  [id number]   MEDICAL RECORD NO.:  [id number]          PATIENT TYPE:  AMB   LOCATION:  SDC                           [facility]ACILITY:  [facility]   PHYSICIAN:  Sherron Monday, MD        DATE OF BIRTH:  Jul 06, 1966   DATE OF PROCEDURE:  11/08/2006  DATE OF DISCHARGE:                               OPERATIVE REPORT   PREOPERATIVE DIAGNOSES:  Pelvic pain, solid left ovarian cyst, right  simple cyst, bilateral hydrosalpinx.   POSTOPERATIVE DIAGNOSES:  Pelvic pain, solid left ovarian cyst, right  simple cyst, bilateral hydrosalpinx.   PROCEDURE:  Operative laparoscopy, left salpingo-oophorectomy, right  salpingectomy, fulguration of endometriosis.    Surgeon: Sherron Monday, M.D/   ASSISTANT:  Zenaida Niece, M.D.   ANESTHESIA:  General with 10 mL of local.   SPECIMENS:  Left tube and ovary, right tube.   EBL:  Minimal.   IV FLUIDS:  1200 mL   URINE OUTPUT:  Fifty mL by I&O counts prior to the procedure.   FINDINGS:  Minimal endometriosis at the vaginal cuff, small left  hydrosalpinx, right cyst of Morgagni, normal liver edge.   COMPLICATIONS:  None.   DISPOSITION:  Stable to PACU following the procedure.   PROCEDURE:  After informed consent was reviewed with the patient,  including the risks, benefits and alternatives of the surgical  procedure, she was transferred to the OR and placed on the table in the  supine position.  General anesthesia was induced and found to be  adequate.  She was then placed in the yellow fin stirrups, prepped and  draped in the normal sterile fashion.  A sponge stick was placed in her  vagina after her bladder had been drained for manipulation of her  vaginal cuff.  A 5 mm infraumbilical incision was made and using a  Veress needle the peritoneum was entered.  This was tested with several  tests of saline and pneumoperitoneum was obtained and using direct entry  a 5 mm port was placed without difficulty.  A brief  pelvic survey was  performed.  A 5 mm right-sided accessory port was placed after  visualization of the umbilical arteries.  Using a blunt trocar the  survey was again performed revealing minimal area of endometriosis on  the right side of the cuff as well as left hydrosalpinx as well as a  clear posterior cul-de-sac, and normal liver edge.  The appendix was  unable to be visualized and thought to be retrocecal.  A 10 mm left-  sided  port was placed.  Using the Gyrus the left tube and ovary was  removed.  Hemostasis was assured using.  Using the Gyrus the right tube  and cyst of Morgagni were removed without difficulty.  Hemostasis was  again assured.  Using the Endo Catch these were removed from the abdomen  without difficulty.  The pedicles were again inspected.  Hemostasis was  assured.  The ports were removed under direct visualization.  The gas  was evacuated from the peritoneal cavity.  The 10 mm left-sided  port  had a  deep suture of 4-0 placed and the skin was closed with 4-0 Vicryl.  The two, 5 mm ports were closed with a subcuticular 4-0 and Dermabond  was applied to all the sites.  The sponge stick was removed from the  vagina.  The patient was awakened in stable condition.  Sponge, lap and  needle counts were correct x2 per the operating staff.  She was taken in  stable condition to the PACU following the procedure.      Sherron Monday, MD  Electronically Signed     JB/MEDQ  D:  11/08/2006  T:  11/08/2006  Job:  [id number]

## 2011-03-25 NOTE — Op Note (Signed)
Hall, Kelsey           ACCOUNT NO.:  [id number]   MEDICAL RECORD NO.:  [id number]          PATIENT TYPE:  AMB   LOCATION:  DAY                          [facility]ACILITY:  [facility]   PHYSICIAN:  Sharlet Salina T. Hoxworth, M.D.DATE OF BIRTH:  [birth year]   DATE OF PROCEDURE:  10/06/2004  DATE OF DISCHARGE:                                 OPERATIVE REPORT   PREOPERATIVE DIAGNOSIS:  Soft tissue infection, posterior neck.   POSTOPERATIVE DIAGNOSIS:  Soft tissue infection, posterior neck.   SURGICAL PROCEDURE:  Incision, drainage, and debridement (skin, subcu, and  fascia) soft tissue infection posterior neck.   SURGEON:  Dr. Johna Sheriff   ANESTHESIA:  General endotracheal.   BRIEF HISTORY:  [name] is a 45 year old female, who presents with  several days of worsening pain, redness, and swelling posterior base of her  neck.  Examination reveals a significant soft tissue infection with an  approximately 10 x 5 cm transversely-oriented area of induration and  erythema over about the C7 vertebra at the posterior neck.  There is a  central area of slight skin necrosis measuring a centimeter or so in  diameter and multiple tiny draining pustules around this.  It is exquisitely  tender.  Incision, drainage, and debridement has been recommended and  accepted.  The nature of the procedure, its indications and risks have been  discussed and understood.  She has received preoperative antibiotics.  She  is brought to the operating room for this procedure.   DESCRIPTION OF OPERATION:  The patient brought to the operating room, placed  in supine position on the operating table, and general endotracheal  anesthesia was induced.  She was positioned laterally, carefully padded, and  the posterior neck sterilely prepped and draped.  I initially performed an  elliptical excision of the obviously necrotic skin and area of pustules, and  this was carried down deep into the subcutaneous tissue.  There was a  fairly  small, discrete abscess cavity, but purulence was essentially infiltrating  throughout the subcutaneous tissue down deep to the superficial fascia with  areas of necrosis.  I then extended the incision transversely a centimeter  or two in each direction and did a fairly extensive debridement of  subcutaneous tissue that was obviously necrotic with pus infiltrating  through the tissue and also a portion of the underlying fascia as well.  This was taken back until the tissue appeared viable and could not express  pus with pressure.  This left a transverse incision about 6 cm in length,  1.5 cm in width which was left open, and the wound was approximately 2-3 cm  deep.  Hemostasis was obtained with cautery.  The soft tissue was  infiltrated with Marcaine without epinephrine.  Hemostasis was assured.  The  wound was packed with moist Betadine gauze.  Dry sterile dressing was  applied.  She was taken to the recovery room in satisfactory condition.     Benj   BTH/MEDQ  D:  10/06/2004  T:  10/06/2004  Job:  [id number]
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
## 2011-03-25 NOTE — Discharge Summary (Signed)
NAMEMLISS, WEDIN NO.:  [id number]   MEDICAL RECORD NO.:  [id number]          PATIENT TYPE:  IPS   LOCATION:  0302                          FACILITY:  [facility]   PHYSICIAN:  Jeanice Lim, M.D. DATE OF BIRTH:  1966/01/10   DATE OF ADMISSION:  09/21/2004  DATE OF DISCHARGE:  09/29/2004                                 DISCHARGE SUMMARY   IDENTIFYING DATA:  This is a 45 year old, separated, Caucasian female,  voluntarily admitted, presenting complaining of manic high.  Relapsed on  crack cocaine a couple of weeks ago.  Using 20 Lortab daily since October  1st.  Mood lability with agitation for four to five months.  No sleep for  two weeks.  Racing thoughts.  History of depression.  Feeling agitated and  unsafe.   MEDICATIONS:  Albuterol, Provigil, Cymbalta, Wellbutrin, Klonopin and Xanax,  prescribed by a different physician.   ALLERGIES:  1.  PENICILLIN.  2.  TORADOL.   PHYSICAL EXAMINATION:  Physical and neurological exams essentially within  normal limits.   LABORATORY DATA:  Routine admission labs essentially within normal limits.   MENTAL STATUS EXAM:  Fully alert, pleasant and cooperative with agitation.  Speech latency with decreased tone and quantity.  Mood agitated, depressed.  Thought processes - Positive without blocking, distracted, derailing.  Cognitively intact.  Judgment and insight fair.   ADMITTING DIAGNOSES:   AXIS I:  1.  Bipolar disorder, type I, mixed state.  2.  History of opiate abuse and cocaine abuse.  3.  Rule out superimposed substance-induced mood disorder on top of bipolar      I mania.   AXIS II:  Deferred.   AXIS III:  1.  Rule out neuroleptic-induced akathisia.  2.  Rule out urinary tract infection.   AXIS IV:  Moderate to severe stressors - Economic problems related to legal  system and financial.   AXIS V:  25/60.   HOSPITAL COURSE:  The patient was admitted and ordered routine p.r.n.  medications and  underwent further monitoring.  Was encouraged to participate  in individual, group and milieu therapies.  Was placed on safety checks.  Was given Haldol and Cogentin now and q.6.  Was started on clonidine and  Librium protocol for safe detox.  Benzodiazepines were discontinued and  Lortab discontinued.  Patient was monitored for safety and underwent further  evaluation.  Clearly had a manic episode for the past three weeks.  Relapsed  after an 18 month period of sobriety.  Had done quite well with relapse  prevention until weeks prior to the relapse.  Patient reported motivation to  become abstinent again and required time for stabilization on medications.  Patient was optimized on Tegretol, used low dose Klonopin with a  discontinuation of Librium due to abuse of benzodiazepines and patient  continued to gradually improve.  Patient complained of pain related to GYN  complaints, but refused to go to ultrasound or see doctor through [facility]  system.  Wanted to go to her own GYN.  Patient continued to be irritable and  anxious, but gradually improved regarding mood and was  discharged in  markedly improved condition.  Mood was euthymic.  Affect full.  Thought  processes goal-directed.  There was no suicidal or homicidal ideation, no  psychotic symptom.  Judgment and insight improved and the patient had no  acute withdrawal symptoms.  Patient reported motivation to be compliant with  follow-up plan and to seek all substance abuse treatment resources available  to her.   DISCHARGE MEDICATIONS:  She was given medication education and discharged  on:  1.  Hydrocodone 1% topical b.i.d.  2.  Tegretol XR 200 mg q.a.m. and two q. 4:00 p.m.  3.  Cogentin 1 mg q.h.s. and one half q.a.m.  4.  Neurontin 300 mg q.h.s. and one half q.a.m.  5.  Thorazine 25 mg q.a.m. and one to two q.h.s.  6.  Vistaril 50 mg q.8 p.r.n.  7.  Wellbutrin XL 300 mg q.a.m.  8.  Cymbalta 30 mg q.a.m.  9.  Advair 100/50  one b.i.d.  10. Klonopin 0.5 mg one q.a.m. and two q. 8:00 p.m.  11. Protonix 40 mg b.i.d.  12. Provigil 200 mg q.a.m.   FOLLOW UP:  Patient was agreeable to follow-up with [facility] and was set up for  walk-in assessment the day following discharge between 1:00 p.m. and 4:00  p.m.   DISCHARGE DIAGNOSES:   AXIS I:  1.  Bipolar disorder, type I, mixed state.  2.  History of opiate abuse and cocaine abuse.  3.  Rule out superimposed substance-induced mood disorder on top of bipolar      I mania.   AXIS II:  Deferred.   AXIS III:  1.  Rule out neuroleptic-induced akathisia.  2.  Rule out urinary tract infection.   AXIS IV:  Moderate to severe stressors - Economic problems related to legal  system and financial.   AXIS V:  Global Assessment of Functioning on discharge was 55.     Jame   JEM/MEDQ  D:  11/01/2004  T:  11/01/2004  Job:  [id number]

## 2011-03-25 NOTE — Discharge Summary (Signed)
[facility]  Patient:    [name], [name]                  MRN: [id number] Adm. Date:  [id number] Disc. Date: [id number] Attending:  Sharlet Salina CC:         Delton Coombes, M.D. [facility] [address] [place], [place] [address]             [address]  [facility]   Discharge Summary  FINAL DIAGNOSES: 1. Dystonic reaction secondary to Paxil. 2. TMJ syndrome. 3. Bronchitis. 4. Gingivitis. 5. Bipolar disorder. 6. Anxiety disorder. 7. History of crack addiction.  HISTORY OF PRESENT ILLNESS:  This 45 year old white female presented to [facility] Emergency Department on the morning of November 24, 2000, via ambulance, with history of having onset the evening prior of episodes in which her jaws would actually lock down, and she would have some problems swallowing with inability to speak.  The patient was complaining bitterly of right ear pain.  She has had a recent URI, and is bringing up discolored sputum.  She is a heavy smoker.  Apparently, she had a couple of episodes, one on the evening of November 23, 2000, and again on the morning of November 24, 2000.  She has a long-standing history of bipolar disorder and anxiety disorder, and is maintained on Neurontin 900 mg daily and Paxil 40 mg daily per physicians at mental health.  She also has a history of crack addiction, and was kicked out of the [employer] approximately two weeks ago for relapsing.  She is now back at [facility].  Her drug screen here was positive only for benzodiazepine Clonopin) and morphine sulfate (opiate).  She had received morphine in the emergency department for jaw pain.  She did actually have an episode in the emergency department late morning in which she had basically a full blown dystonic reaction with her neck arching backwards and jaws clenching down.  She salivated.  She really was unable to speak, and this episode lasted some 20 to 30  minutes.  It was treated with Toradol IV and IV Benadryl by emergency department physician.  Subsequently, she received several doses of morphine sulfate for pain.  She had no evidence of ear infection.  I think the ear pain is clearly related to a sore right TM joint. A panorex view was obtained of the TM joint by the emergency department physician and it was unremarkable.  I obtained a chest x-ray showing what is believed to be some atelectasis and no frank infiltrate.  She did have a slight elevation of SGPT which in retrospect could have been due to a muscle component with enzyme released into the blood stream with this dystonic reaction.  Several labs are pending at the time of discharge, including a hepatitis panel.  She has a history of a "liver infection" many years ago, it is not clear to me what the exact diagnosis was.  She denies IV drug abuse. She has been hospitalized a couple of times at [facility], most recently in November 2001, after a suicidal gesture.  She had cocaine in her system at that time according to records at [facility].  She says that she has no transportation and no money to buy her medications.  She recently started living at [facility], and apparently has to get a job immediately in order to pay the $85.00 a week required for her to stay there.  Formerly worked at [employer] until she had a relapse of crack addiction.  In any case, she had no further recurrence of full blown dystonic reactions while in the hospital.  The day following admission she was noted to be doing pretty well, but complained of feeling "ancy."  I think she received an additional dose of Clonopin per on call physician, but basically explained to her that we were switching her from Paxil to Celexa and that she might feel some lack of sedation because of that.  We have an extensive taper for her to do gradually moving off of Paxil and on to Celexa over the next week.  We have arranged  for her to be seen at [facility] on Tuesday at 9 a.m. at [facility].  She was encouraged to follow up with [facility] for her medical care, and to contact [facility] to see if she qualifies for any type of Medicaid to cover this hospitalization.  I have obtained from my office samples of Levaquin 500 mg q.d. x 7 days to treat her bronchitis, Advair inhaler 250/50 2 sprays p.o. q.12h. x 2 weeks.  She has a Ventolin inhaler which was ordered here at the hospital, 2 sprays p.o. q.i.d. p.r.n.  She is to continue with Neurontin 900 mg at bedtime, Clonopin 1 mg q.i.d.  She will be tapered off of Paxil as follows:  On November 27, 2000, she will receive 20 mg of Paxil, on Tuesday, 10 mg of Paxil, Wednesday, November 29, 2000, 10 mg of Paxil, Thursday, November 30, 2000, 10 mg of Paxil, and Friday, December 01, 2000, no Paxil.  Celexa will be given as follows:  Monday, November 27, 2000, she will receive 20 mg of Celexa, on Tuesday, November 28, 2000, 30 mg of Celexa, Wednesday, November 29, 2000, 30 mg of Celexa which will be continued until Friday, December 01, 2000, when she will increase to 40 mg of Celexa.  I have also provided Celexa samples for one week from my office.  She also asked for samples or hospital provided ibuprofen for the TM joint pain.  She was told by hospital staff that none of that was available for take home.  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  So, she does have refills on Clonopin prescription. Suggested that she try to find money to buy just a few doses to last until Tuesday when she could be seen at mental health.  She does have money for cigarettes apparently, or at least someone provides cigarettes for her, and she smokes heavily a pack a day.  She was advised to take Extra Strength Tylenol in between doses of ibuprofen for the TM joint pain, and to use a moist heating pad or  hot washcloth to the right TM joint.  Explained to her  that this should get better over the next week or so, and not to eat any foods such as steak, which would aggravate the TM joint, and not to chew gum. Explained to her that TM joint pain was usually very manageable and was basically an aggravation, and not expected to be a significant problem. Yesterday she had asked about an injection of Ativan because she was ancy, and I told her that I did not think that was appropriate.  She exhibits some manipulative type behavior and some questionable drug seeking behavior.  She does have a history of prescription drug abuse in  the past, in addition to her crack habit. DD:  11/26/00 TD:  11/27/00 Job: [id number]

## 2011-07-29 LAB — COMPREHENSIVE METABOLIC PANEL WITH GFR
ALT: 248 — ABNORMAL HIGH
AST: 108 — ABNORMAL HIGH
Albumin: 3.5
Alkaline Phosphatase: 94
BUN: 12
CO2: 25
Calcium: 9.7
Chloride: 103
Creatinine, Ser: 0.73
GFR calc non Af Amer: >60
Glucose, Bld: 87
Potassium: 4
Sodium: 135
Total Bilirubin: 0.5
Total Protein: 7.1

## 2011-07-29 LAB — BASIC METABOLIC PANEL
BUN: 10
CO2: 27
Chloride: 104
Creatinine, Ser: 0.87
Potassium: 3.7

## 2011-07-29 LAB — URINALYSIS, ROUTINE W REFLEX MICROSCOPIC
Bilirubin Urine: NEGATIVE
Glucose, UA: NEGATIVE
Ketones, ur: NEGATIVE
Leukocytes, UA: NEGATIVE
Leukocytes, UA: NEGATIVE
Nitrite: POSITIVE — AB
Protein, ur: NEGATIVE
Specific Gravity, Urine: 1.012
Urobilinogen, UA: 0.2
Urobilinogen, UA: 0.2
pH: 5.5

## 2011-07-29 LAB — DIFFERENTIAL
Basophils Absolute: 0
Basophils Relative: 0
Eosinophils Absolute: 0.2
Eosinophils Relative: 3
Eosinophils Relative: 2
Lymphocytes Relative: 27
Lymphs Abs: 2.2
Lymphs Abs: 2.5
Monocytes Relative: 7
Neutro Abs: 5.1

## 2011-07-29 LAB — LIPASE, BLOOD: Lipase: 19

## 2011-07-29 LAB — GC/CHLAMYDIA PROBE AMP, GENITAL
Chlamydia, DNA Probe: NEGATIVE
GC Probe Amp, Genital: NEGATIVE

## 2011-07-29 LAB — URINE MICROSCOPIC-ADD ON

## 2011-07-29 LAB — WET PREP, GENITAL: Trich, Wet Prep: NONE SEEN

## 2011-07-29 LAB — CBC
HCT: 41.8
HCT: 42.9
MCHC: 34.8
MCHC: 35.4
MCV: 91.8
Platelets: 240
Platelets: 307
RBC: 4.67
RDW: 12.7
WBC: 7

## 2011-08-23 LAB — URINALYSIS, ROUTINE W REFLEX MICROSCOPIC
Glucose, UA: NEGATIVE
Hgb urine dipstick: NEGATIVE
Ketones, ur: 15 — AB
Protein, ur: NEGATIVE
Urobilinogen, UA: 0.2

## 2011-08-23 LAB — COMPREHENSIVE METABOLIC PANEL
Albumin: 3.5
BUN: 9
Calcium: 9.1
Creatinine, Ser: 0.81
GFR calc Af Amer: >60
Total Bilirubin: 0.6
Total Protein: 7

## 2011-08-23 LAB — DIFFERENTIAL
Basophils Absolute: 0
Lymphocytes Relative: 26
Lymphs Abs: 2
Monocytes Absolute: 0.6
Neutro Abs: 4.8

## 2011-08-23 LAB — CBC
HCT: 42.2
MCHC: 34.8
MCV: 90.5
Platelets: 287
RDW: 13.1

## 2015-09-19 ENCOUNTER — Emergency Department (HOSPITAL_BASED_OUTPATIENT_CLINIC_OR_DEPARTMENT_OTHER)
Admission: EM | Admit: 2015-09-19 | Discharge: 2015-09-19 | Disposition: A | Discharge status: Home / Self Care | Payer: Self-pay | Attending: Physician Assistant | Admitting: Physician Assistant

## 2015-09-19 ENCOUNTER — Emergency Department (HOSPITAL_BASED_OUTPATIENT_CLINIC_OR_DEPARTMENT_OTHER): Payer: Self-pay

## 2015-09-19 ENCOUNTER — Encounter (HOSPITAL_BASED_OUTPATIENT_CLINIC_OR_DEPARTMENT_OTHER): Payer: Self-pay | Admitting: *Deleted

## 2015-09-19 DIAGNOSIS — Z880 Allergy status to penicillin: Secondary | ICD-10-CM | POA: Insufficient documentation

## 2015-09-19 DIAGNOSIS — F17210 Nicotine dependence, cigarettes, uncomplicated: Secondary | ICD-10-CM | POA: Insufficient documentation

## 2015-09-19 DIAGNOSIS — Z79899 Other long term (current) drug therapy: Secondary | ICD-10-CM | POA: Insufficient documentation

## 2015-09-19 DIAGNOSIS — M25551 Pain in right hip: Secondary | ICD-10-CM | POA: Insufficient documentation

## 2015-09-19 DIAGNOSIS — F3130 Bipolar disorder, current episode depressed, mild or moderate severity, unspecified: Secondary | ICD-10-CM | POA: Insufficient documentation

## 2015-09-19 HISTORY — DX: Bipolar disorder, unspecified: F31.9

## 2015-09-19 MED ORDER — TRAMADOL HCL 50 MG PO TABS: 50.0000 mg | ORAL_TABLET | Freq: Four times a day (QID) | ORAL | Status: DC | PRN

## 2015-09-19 MED ORDER — OXYCODONE-ACETAMINOPHEN 5-325 MG PO TABS
1.0000 | ORAL_TABLET | Freq: Once | ORAL | Status: AC
  Administered 2015-09-19: 1 via ORAL
  Filled 2015-09-19: qty 1

## 2015-09-19 NOTE — Discharge Instructions (Signed)
°Emergency Department Resource Guide °1) Find a Doctor and Pay Out of Pocket °Although you won't have to find out who is covered by your insurance plan, it is a good idea to ask around and get recommendations. You will then need to call the office and see if the doctor you have chosen will accept you as a new patient and what types of options they offer for patients who are self-pay. Some doctors offer discounts or will set up payment plans for their patients who do not have insurance, but you will need to ask so you aren't surprised when you get to your appointment. ° °2) Contact Your Local Health Department °Not all health departments have doctors that can see patients for sick visits, but many do, so it is worth a call to see if yours does. If you don't know where your local health department is, you can check in your phone book. The CDC also has a tool to help you locate your state's health department, and many state websites also have listings of all of their local health departments. ° °3) Find a Walk-in Clinic °If your illness is not likely to be very severe or complicated, you may want to try a walk in clinic. These are popping up all over the country in pharmacies, drugstores, and shopping centers. They're usually staffed by nurse practitioners or physician assistants that have been trained to treat common illnesses and complaints. They're usually fairly quick and inexpensive. However, if you have serious medical issues or chronic medical problems, these are probably not your best option. ° °No Primary Care Doctor: °- Call [facility] Connect at  832-8000 - they can help you locate a primary care doctor that  accepts your insurance, provides certain services, etc. °- Physician Referral Service- 1-800-533-3463 ° °Chronic Pain Problems: °Organization         Address  Phone   Notes  °[facility]  (336) 297-2271 Patients need to be referred by their primary care doctor.  ° °Medication  Assistance: °Organization         Address  Phone   Notes  °[place] [facility] [address] °[place], [place] [address] (336) 641-8030 --Must be a resident of [place] °-- Must have NO insurance coverage whatsoever (no Medicaid/ Medicare, etc.) °-- The pt. MUST have a primary care doctor that directs their care regularly and follows them in the community °  °[facility]  (866) 331-1348   °[employer]  (888) 892-1162   ° °Agencies that provide inexpensive medical care: °Organization         Address  Phone   Notes  °[facility]  (336) 832-8035   °[facility]    (336) 832-7272   °[facility] [address] °[place], [place] [address] (336) 832-4777   °[facility] [address], °[place] (336) 271-4999   °[facility]    (336) 373-0678   °[facility]    (336) 272-1050   °[facility] ° [address], [place] Phone:  (336) 832-4444, Fax:  (336) 832-4440 Hours of Operation:  9 am - 6 pm, M-F.  Also accepts Medicaid/Medicare and self-pay.  °[facility] ° [address], [place] Phone: (336) 832-3150, Fax: (336) 832-3151. Hours of Operation:  8:30 am - 5:30 pm, M-F.  Also accepts Medicaid and self-pay.  °[facility] [place] [address], [place] Phone: (336) 878-6027   °[facility] [address], [place], [place] (336)723-1848, Ext. 123 Mondays & Thursdays: 7-9 AM.  First 15 patients are seen on a first come, first serve basis. °  ° °Medicaid-accepting [place] Providers: ° °Organization         Address  Phone   Notes  °[facility] [address], [place] (336) 641-2100 Also accepts self-pay patients.  °[facility] [address], [place] ° (336) 856-9996   °[facility] [address], [place]  (336) 288-8857   °[facility] [address] [place] [address], [place] (336) 299-7000   °Veita Bland [address], [place]  ° (336) 373-1557 Only accepts [place] Access Medicaid patients after they have their name applied to their card.  ° °Self-Pay (no insurance) in [place]: ° °Organization         Address  Phone   Notes  °Sickle Cell Patients, [facility] [address], [place] (336) 832-1970   °[facility] [address], [place] (336) 832-4400   °[facility] ° [address], [place] (336) 992-4800   °[facility]/Dr. Osei-Bonsu ° [address] [place] [address], [place] or [address], [place] (336) 841-8500 Phone number for both [place] and [place] locations is the same.  °[facility] [address], [place] (336) 299-0000   °[facility] [address] [place] [address], [place] or [address] (336) 852-7530 °(336) 878-2260   °[facility] [address], [place] (336) 350-1642, phone; (336) 294-5005, fax Sees patients 1st and 3rd Saturday of every month.  Must not qualify for public or private insurance (i.e. Medicaid, Medicare, [place] Health Choice, Veterans' Benefits) • Household income should be no more than 200% of the poverty level •The clinic cannot treat you if you are pregnant or think you are pregnant • Sexually transmitted diseases are not treated at the clinic.  ° ° °Dental Care: °Organization         Address  Phone  Notes  °[place] [facility] [address], [place] (336) 641-6152 Accepts children up to age 21 who are enrolled in Medicaid or [place] Health Choice; pregnant women with a Medicaid card; and children who have applied for Medicaid or [place] Health Choice, but were declined, whose parents can pay a reduced fee at time of service.  °[place]  [facility] [place]  [address], [place] (336) 641-7733 Accepts children up to age 21 who are enrolled in Medicaid or [place] Health Choice; pregnant women with a Medicaid card; and children who have applied for Medicaid or [place] Health Choice, but were declined, whose parents can pay a reduced fee at time of service.  °[facility] ° [address],  (336) 641-4533 Patients are seen by appointment only. Walk-ins are not accepted. [facility] will see patients 18 years of age and older. °Monday - Tuesday (8am-5pm) °Most Wednesdays (8:30-5pm) °$30 per visit, cash only  °[facility] ° [address], [place] (336) 641-4533 Patients are seen by appointment only. Walk-ins are not accepted. [facility] will see patients 18 years of age and older. °One   Wednesday Evening (Monthly: Volunteer Based).  $30 per visit, cash only  °[facility]  (919) 537-3737 for adults; Children under age 4, call Graduate Pediatric Dentistry at (919) [phone]. Children aged 4-14, please call (919) [phone] to request a pediatric application. ° Dental services are provided in all areas of dental care including fillings, crowns and bridges, complete and partial dentures, implants, gum treatment, root canals, and extractions. Preventive care is also provided. Treatment is provided to both adults and children. °Patients are selected via a lottery and there is often a waiting list. °  °[facility] [address], °[place] ° (336) 763-8833 [email or web address] °  °[facility] [address], [place], [place] (336)723-1848, Ext. 123 Second and Fourth Thursday of each month, opens at 6:30 AM; Clinic ends at 9 AM.  Patients are seen on a first-come first-served basis, and a limited number are seen during each clinic.  ° °[facility] ° [address], [place], [place] (336) 723-7904    Eligibility Requirements °You must have lived in [place], [place], or [place] counties for at least the last three months. °  You cannot be eligible for state or federal sponsored healthcare insurance, including Veterans Administration, Medicaid, or Medicare. °  You generally cannot be eligible for healthcare insurance through your employer.  °  How to apply: °Eligibility screenings are held every Tuesday and Wednesday afternoon from 1:00 pm until 4:00 pm. You do not need an appointment for the interview!  °[facility] [address], [place], [place] 336-631-2330   °[facility] [facility]  336-342-8273   °[facility]  336-703-3100   °[facility]  336-570-6415   ° °Behavioral Health Resources in the Community: °Intensive Outpatient Programs °Organization         Address  Phone  Notes  °[facility] [address], [place], [place] 336-878-6098   °[facility] [address], [place], [place] 336-832-9800   °[facility] [address], [place], [place] ° 336-882-2125   °[facility] [address],  °[place], [place] 1-800-853-5163 or 336-641-4981   °Substance Abuse Resources °Organization         Address  Phone  Notes  °[facility]  336-882-2125   °[facility]  336-784-9470   °[facility]  336-285-9073   °[facility]  336-845-3988   °Residential & Outpatient Substance Abuse Program  1-800-659-3381   °Psychological Services °Organization         Address  Phone  Notes  °[facility]  336- 832-9600   °[facility]  336- 378-7881   °[facility] [address], [place] 1-800-853-5163 or 336-641-4981   ° °Mobile Crisis Teams °Organization         Address  Phone  Notes  °[facility], Mobile Crisis Care Unit  1-877-626-1772   °Assertive °[facility] ° [address]  [place], [place] 336-834-9664   °Sharon DeEsch [address] °[place] 336-554-5454   ° °Self-Help/Support Groups °Organization         Address  Phone             Notes  °[facility] - variety of support groups  336- 373-1402 Call for more information  °Narcotics Anonymous (NA), [facility] [address], °[place]  2 meetings at this location  ° °  Residential Treatment Programs °Organization         Address  Phone  Notes  °[facility] [address],    °[place]  1-866-801-8205   °[facility] ° [address], [place], [place] 704-293-8524   °[facility] [address], [place] 336-845-3988 Admissions: 8am-3pm M-F  °[facility] [address]    °[place], [place] 336-841-1104   °[facility] [address], [place], [place] 336-379-7146   °[facility] [address]  °[place], [place] 336-285-9073   °[facility] - Intensive Outpatient [address], [place], [place] 336-852-3033   °[facility] [address]  °[place], [place] 1-877-615-2722 or 336-784-9470   °[facility] [address] [place], [place] 336-227-7417 Accepts Medicaid  °[facility] [address]  °[place] 1-800-659-3381 Substance Abuse/Addiction Treatment  ° °[place] Behavioral Health Resources °Organization         Address  Phone  Notes  °[facility]  (888) 581-9988   °Julie Brannon, PhD [address] [place], [place]   (336) 349-5553 or (336) 951-0000   °[facility]   [address] °[place], [place] (336) 349-4454   °[facility] [address], [place], [place] (336) 342-8316 Insurance/Medicaid/sponsorship through [facility]  °[facility] [address]                                    [place], [place] (336) 342-8316 Therapy/tele-psych/case    °[facility] [address]  ° [place], [place] (336) 349-2233    °Dr. Arfeen  (336) 349-4544   °[facility] [place]  [employer] [place] [facility] 1) [address], [place] °2) [address], [place] °3)  [address], [place] (336) 349-3220 °(336) 342-7768 ° °(336) 342-8140   °[place] [facility] (336) 342-1394 or (336) 342-3537 (After Hours)    ° ° °

## 2015-09-19 NOTE — ED Notes (Signed)
MD at bedside. 

## 2015-09-19 NOTE — ED Notes (Signed)
Pt reports right hip and thigh pain x 2 weeks- worse since yesterday

## 2015-09-19 NOTE — ED Provider Notes (Signed)
CSN: [id number]     Arrival date & time 09/19/15  1497 History   First MD Initiated Contact with Patient 09/19/15 [phone]     Chief Complaint  Patient presents with  . Hip Pain     (Consider location/radiation/quality/duration/timing/severity/associated sxs/prior Treatment) HPI   Is a 49 year old female presenting with hip pain for last 2 weeks. Patient reports that she's been walking on her feet a lot at work and she feels like the more she stands and walks the more her hip hurts. She's had no recent trauma.  No fevers. Patient denies IV drug use. Patient has no weakness. No numbness/tingling.     Past Medical History  Diagnosis Date  . Bipolar depression [facility])    Past Surgical History  Procedure Laterality Date  . Abdominal hysterectomy     No family history on file. Social History  Substance Use Topics  . Smoking status: Current Every Day Smoker    Types: Cigarettes  . Smokeless tobacco: Never Used  . Alcohol Use: No   OB History    No data available     Review of Systems  Constitutional: Negative for activity change.  Respiratory: Negative for shortness of breath.   Cardiovascular: Negative for chest pain.  Gastrointestinal: Negative for abdominal pain.  Musculoskeletal: Positive for arthralgias.      Allergies  Penicillins  Home Medications   Prior to Admission medications   Medication Sig Start Date End Date Taking? Authorizing Provider  buPROPion (WELLBUTRIN SR) 150 MG 12 hr tablet Take 150 mg by mouth 2 (two) times daily.   Yes Historical Provider, MD  gabapentin (NEURONTIN) 800 MG tablet Take 800 mg by mouth 3 (three) times daily.   Yes Historical Provider, MD  trazodone (DESYREL) 300 MG tablet Take 300 mg by mouth at bedtime.   Yes Historical Provider, MD  traMADol (ULTRAM) 50 MG tablet Take 1 tablet (50 mg total) by mouth every 6 (six) hours as needed. 09/19/15   Courteney Lyn Mackuen, MD   BP 117/84 mmHg  Pulse 71  Temp(Src) 98.2 F (36.8 C)  (Oral)  Resp 20  Ht '5\' 3"'$  (1.6 m)  Wt 173 lb (78.472 kg)  BMI 30.65 kg/m2  SpO2 96% Physical Exam  Constitutional: She is oriented to person, place, and time. She appears well-developed and well-nourished.  HENT:  Head: Normocephalic and atraumatic.  Eyes: Right eye exhibits no discharge.  Cardiovascular: Normal rate, regular rhythm and normal heart sounds.   No murmur heard. Pulmonary/Chest: Effort normal and breath sounds normal. She has no wheezes. She has no rales.  Abdominal: Soft. She exhibits no distension. There is no tenderness.  Musculoskeletal:  Patient has pain in her hip when standing. Normal range of motion. In both knee hip and ankle. No pain with range of motion. No overlying erythema. No rash  Neurological: She is oriented to person, place, and time.  Skin: Skin is warm and dry. She is not diaphoretic.  Psychiatric: She has a normal mood and affect.  Nursing note and vitals reviewed.   ED Course  Procedures (including critical care time) Labs Review Labs Reviewed - No data to display  Imaging Review Dg Hip Unilat With Pelvis 2-3 Views Right  09/19/2015  CLINICAL DATA:  Acute right hip pain for 2 weeks. EXAM: DG HIP (WITH OR WITHOUT PELVIS) 2-3V RIGHT COMPARISON:  07/07/2015 FINDINGS: There is no evidence of hip fracture or dislocation. There is no evidence of arthropathy or other focal bone abnormality. IMPRESSION: Negative. Electronically  Signed   By: Jerilynn Mages.  Shick M.D.   On: 09/19/2015 10:49   I have personally reviewed and evaluated these images and lab results as part of my medical decision-making.   EKG Interpretation None      MDM   Final diagnoses:  Hip pain, right    Patient is a 49 year old female presenting today with hip pain. Patient reports the hip pain similar to last 2 weeks. She has no insurance and therefore has no primary care physician. She reports is worse with standing prolonged periods of time. She's got no pain with range of motion.  Doubt any kind of septic arthritis. She's got no  overlying erythema. She's got normal strength and sensation.   We'll get x-ray and then plan to discharge home with ibuprofen and tramadol.   Courteney Julio Alm, MD 09/19/15 1109

## 2017-06-27 DIAGNOSIS — Z01818 Encounter for other preprocedural examination: Secondary | ICD-10-CM

## 2017-07-03 DIAGNOSIS — Z01818 Encounter for other preprocedural examination: Secondary | ICD-10-CM

## 2017-07-21 DIAGNOSIS — I82409 Acute embolism and thrombosis of unspecified deep veins of unspecified lower extremity: Secondary | ICD-10-CM

## 2017-07-21 DIAGNOSIS — I2699 Other pulmonary embolism without acute cor pulmonale: Secondary | ICD-10-CM

## 2017-07-21 DIAGNOSIS — Z720 Tobacco use: Secondary | ICD-10-CM

## 2017-07-22 DIAGNOSIS — I2699 Other pulmonary embolism without acute cor pulmonale: Secondary | ICD-10-CM

## 2017-09-04 ENCOUNTER — Other Ambulatory Visit (HOSPITAL_BASED_OUTPATIENT_CLINIC_OR_DEPARTMENT_OTHER): Payer: Self-pay

## 2017-09-04 ENCOUNTER — Ambulatory Visit (HOSPITAL_BASED_OUTPATIENT_CLINIC_OR_DEPARTMENT_OTHER): Payer: Self-pay | Admitting: Hematology & Oncology

## 2017-09-04 VITALS — BP 129/87 | HR 71 | Temp 98.4°F | Resp 18 | Wt 199.0 lb

## 2017-09-04 DIAGNOSIS — I82402 Acute embolism and thrombosis of unspecified deep veins of left lower extremity: Secondary | ICD-10-CM

## 2017-09-04 DIAGNOSIS — M79652 Pain in left thigh: Secondary | ICD-10-CM

## 2017-09-04 DIAGNOSIS — I2699 Other pulmonary embolism without acute cor pulmonale: Secondary | ICD-10-CM | POA: Insufficient documentation

## 2017-09-04 DIAGNOSIS — Z808 Family history of malignant neoplasm of other organs or systems: Secondary | ICD-10-CM

## 2017-09-04 DIAGNOSIS — Z720 Tobacco use: Secondary | ICD-10-CM

## 2017-09-04 DIAGNOSIS — I87009 Postthrombotic syndrome without complications of unspecified extremity: Secondary | ICD-10-CM

## 2017-09-04 DIAGNOSIS — Z803 Family history of malignant neoplasm of breast: Secondary | ICD-10-CM

## 2017-09-04 DIAGNOSIS — I749 Embolism and thrombosis of unspecified artery: Secondary | ICD-10-CM

## 2017-09-04 DIAGNOSIS — Z800 Family history of malignant neoplasm of digestive organs: Secondary | ICD-10-CM

## 2017-09-04 DIAGNOSIS — Z8249 Family history of ischemic heart disease and other diseases of the circulatory system: Secondary | ICD-10-CM

## 2017-09-04 LAB — CBC WITH DIFFERENTIAL (CANCER CENTER ONLY)
BASO#: 0 10*3/uL (ref 0.0–0.2)
BASO%: 0.4 % (ref 0.0–2.0)
EOS%: 1.3 % (ref 0.0–7.0)
Eosinophils Absolute: 0.1 10*3/uL (ref 0.0–0.5)
HCT: 40.8 % (ref 34.8–46.6)
HEMOGLOBIN: 13.6 g/dL (ref 11.6–15.9)
LYMPH#: 1.7 10*3/uL (ref 0.9–3.3)
LYMPH%: 32.3 % (ref 14.0–48.0)
MCH: 31.1 pg (ref 26.0–34.0)
MCHC: 33.3 g/dL (ref 32.0–36.0)
MCV: 93 fL (ref 81–101)
MONO#: 0.3 10*3/uL (ref 0.1–0.9)
MONO%: 5.7 % (ref 0.0–13.0)
NEUT%: 60.3 % (ref 39.6–80.0)
NEUTROS ABS: 3.2 10*3/uL (ref 1.5–6.5)
Platelets: 222 10*3/uL (ref 145–400)
RBC: 4.38 10*6/uL (ref 3.70–5.32)
RDW: 13.4 % (ref 11.1–15.7)
WBC: 5.3 10*3/uL (ref 3.9–10.0)

## 2017-09-04 LAB — CMP (CANCER CENTER ONLY)
ALBUMIN: 3.3 g/dL (ref 3.3–5.5)
ALT(SGPT): 43 U/L (ref 10–47)
AST: 31 U/L (ref 11–38)
Alkaline Phosphatase: 111 U/L — ABNORMAL HIGH (ref 26–84)
BUN, Bld: 7 mg/dL (ref 7–22)
CHLORIDE: 102 meq/L (ref 98–108)
CO2: 27 mEq/L (ref 18–33)
Calcium: 9.3 mg/dL (ref 8.0–10.3)
Creat: 1.2 mg/dl (ref 0.6–1.2)
Glucose, Bld: 101 mg/dL (ref 73–118)
POTASSIUM: 3.5 meq/L (ref 3.3–4.7)
SODIUM: 142 meq/L (ref 128–145)
Total Bilirubin: 0.5 mg/dl (ref 0.20–1.60)
Total Protein: 7.6 g/dL (ref 6.4–8.1)

## 2017-09-04 MED ORDER — TRAMADOL HCL 50 MG PO TABS: 50.0000 mg | ORAL_TABLET | Freq: Four times a day (QID) | ORAL | 0 refills | Status: DC | PRN

## 2017-09-04 NOTE — Progress Notes (Signed)
Referral MD  Reason for Referral: Bilateral pulmonary emboli, left lower extremity thrombus, status post fracture of left ankle   Chief Complaint  Patient presents with  . New Patient (Initial Visit)  : I have blood clots in my lung and left leg.  HPI: [name] is a very nice 51 year old postmenopausal white female. She had her hysterectomy back in her 90s. She does have one ovary.  She does smoke. She probably has a 40-pack-year history of tobacco use. She only smokes a couple cigarettes a day.  Back in mid August, she was holding a grandchild. She is walking down a hill. She fell and shattered her left ankle. She had surgery a week later.  About 2 weeks after surgery, she began have pain and swelling in the left leg. She did not feel well. She went to see her orthopedist. They told her to go to the emergency room. She was found to have a blood clot in the left leg. She was admitted after she was found to have bilateral pulmonary emboli. This was down and [facility].  Because of lack of insurance, she was kept one day and put on ELIQUIS.  She is having a lot of pain in the left thigh. She has been on tramadol. She is also on a high-dose of gabapentin for "mood stabilization."  She says that in her family her brother and father on lifelong anticoagulation.  She's had one miscarriage. She has 2 daughters. An older daughter has had a miscarriage.  She was only on oral contraceptives for "a short time" back in her 63s.  She has had surgeries without any difficulty.  She has had no bleeding. She's had some urinary frequency. There is no dysuria or hematuria.  She's had no chest wall pain. She's had no cough. Is no hemoptysis.  She does have underlying COPD. She is on inhalers.  Her mother had breast cancer, colon cancer and melanoma. [name] is supposed to have a colonoscopy but has not been able to get one scheduled yet.  She was kindly referred to the [facility] to help with management of the thromboembolic disease.  She's had no fever. There is no rashes.  Overall, her performance status is ECOG 1.   Past Medical History:  Diagnosis Date  . Bipolar depression ([place])   :  Past Surgical History:  Procedure Laterality Date  . ABDOMINAL HYSTERECTOMY    :   Current Outpatient Prescriptions:  .  apixaban (ELIQUIS) 5 MG TABS tablet, Take 5 mg by mouth 2 (two) times daily., Disp: , Rfl:  .  buPROPion (WELLBUTRIN SR) 150 MG 12 hr tablet, Take 150 mg by mouth 2 (two) times daily., Disp: , Rfl:  .  gabapentin (NEURONTIN) 800 MG tablet, Take 800 mg by mouth 3 (three) times daily., Disp: , Rfl:  .  traMADol (ULTRAM) 50 MG tablet, Take 1 tablet (50 mg total) by mouth every 6 (six) hours as needed., Disp: 60 tablet, Rfl: 0 .  trazodone (DESYREL) 300 MG tablet, Take 300 mg by mouth at bedtime., Disp: , Rfl: :  :  Allergies  Allergen Reactions  . Penicillins Swelling and Hives    Can take keflex per the patient (can't afford expensive meds like vantin)  . Sulfamethoxazole Hives  :  No family history on file.:  Social History   Social History  . Marital status: Legally Separated    Spouse name: N/A  . Number of children: N/A  .  Years of education: N/A   Occupational History  . Not on file.   Social History Main Topics  . Smoking status: Current Every Day Smoker    Types: Cigarettes  . Smokeless tobacco: Never Used  . Alcohol use No  . Drug use: No  . Sexual activity: Yes    Birth control/ protection: Surgical   Other Topics Concern  . Not on file   Social History Narrative  . No narrative on file  :  Pertinent items are noted in HPI.  Exam:Moderately obese white female in no obvious distress. Vital signs show temperature of 98.4. Pulse 71. Blood pressure 129/87. Weight is 200 pounds. Head and neck exam shows no ocular or oral lesions. There are no palpable cervical or supraclavicular lymph nodes. Lungs  are clear bilaterally. She has no wheezing or rales. Cardiac exam regular rate and rhythm with no murmurs, rubs or bruits. Abdomen is soft. She is moderately obese. She has no fluid wave. There is no guarding or rebound tenderness. She has no palpable abdominal mass. There is no inguinal adenopathy bilaterally. She has no palpable liver or spleen tip. Back exam shows no tenderness over the spine, ribs or hips. Extremities shows a walking boot on the left lower leg. She has some slight swelling of the left thigh. She is tender in the left thigh. Right leg is unremarkable. Skin exam shows some slightly dry skin. There is no rashes, ecchymoses or petechia. Neurological exam shows no focal neurological deficits.    Recent Labs  09/04/17 1325  WBC 5.3  HGB 13.6  HCT 40.8  PLT 222    Recent Labs  09/04/17 1325  NA 142  K 3.5  CL 102  CO2 27  GLUCOSE 101  BUN 7  CREATININE 1.2  CALCIUM 9.3    Blood smear review:  None  Pathology: None     Assessment and Plan:  Ms. Hall is a very charming 51 year old white female. 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 Unfortunately, the referring physician never set up the scans or Doppler results. We'll have to call to get the results so that we can see the extent of her disease.  We will keep her on ELIQUIS. I suspect she will need at least one year for most ELIQUIS. I would clearly put her on maintenance ELIQUIS afterwards.  It is clearly possible that there is a hypercoagulable state given that her brother and father are on long-term anticoagulation.  I would like to repeat her CT angiogram and Doppler we see her back in about 6 or 7 weeks. I want to see how she is responding.  She is at risk for post phlebitic syndrome. Unfortunately she cannot wear a stocking on the left leg because of the walking boot that she has. Hopefully, the walking boot will come off in November.  I will try  her on tramadol to try to help with the pain. She artery is on high-dose of gabapentin which does not seem to be helping.  She is going to try to stop smoking. This is her one risk factor that we can modify.  [name] is very nice. She has a very good attitude. I like her personality. She has a good understanding of the problem. She is truly blessed with 7 grandkids already. She wants to be able to watch them grow up.  When we see her back, we will get our CT angiogram and Doppler.  I spent about 50 minutes with her today. I answered all of her questions. She has a good understanding of what is happening and why I recommended ELIQUIS for at least one year.

## 2017-09-05 LAB — ANTITHROMBIN III: ANTITHROMBIN ACTIVITY: 128 % (ref 75–135)

## 2017-09-05 LAB — D-DIMER, QUANTITATIVE: D-DIMER: 0.74 mg/L FEU — ABNORMAL HIGH (ref 0.00–0.49)

## 2017-09-05 LAB — PROTEIN C ACTIVITY: PROTEIN C ACTIVITY: 140 % (ref 73–180)

## 2017-09-05 LAB — PROTEIN S, TOTAL: Protein S, Total: 125 % (ref 60–150)

## 2017-09-05 LAB — PROTEIN S ACTIVITY: PROTEIN S ACTIVITY: 119 % (ref 63–140)

## 2017-09-06 LAB — CARDIOLIPIN ANTIBODIES, IGG, IGM, IGA
Anticardiolipin Ab,IgA,Qn: <9 APL U/mL (ref 0–11)
Anticardiolipin Ab,IgM,Qn: <9 MPL U/mL (ref 0–12)

## 2017-09-06 LAB — LUPUS ANTICOAGULANT PANEL
DRVVT MIX: 53.4 s — AB (ref 0.0–47.0)
PTT-LA: 36.4 s (ref 0.0–51.9)
dRVVT Confirm: 1.3 ratio — ABNORMAL HIGH (ref 0.8–1.2)
dRVVT: 76.6 s — ABNORMAL HIGH (ref 0.0–47.0)

## 2017-09-06 LAB — BETA-2-GLYCOPROTEIN I ABS, IGG/M/A: Beta-2 Glycoprotein I Ab, IgG: <9 GPI IgG units (ref 0–20)

## 2017-09-06 LAB — PROTEIN C, TOTAL: PROTEIN C ANTIGEN: 106 % (ref 60–150)

## 2017-09-07 LAB — FACTOR 5 LEIDEN

## 2017-09-08 LAB — PROTHROMBIN GENE MUTATION

## 2017-10-11 ENCOUNTER — Encounter (HOSPITAL_BASED_OUTPATIENT_CLINIC_OR_DEPARTMENT_OTHER): Payer: Self-pay

## 2017-10-11 ENCOUNTER — Ambulatory Visit (HOSPITAL_BASED_OUTPATIENT_CLINIC_OR_DEPARTMENT_OTHER)
Admission: RE | Admit: 2017-10-11 | Discharge: 2017-10-11 | Disposition: A | Discharge status: Home / Self Care | Payer: Self-pay | Source: Ambulatory Visit | Attending: Hematology & Oncology | Admitting: Hematology & Oncology

## 2017-10-11 ENCOUNTER — Encounter: Payer: Self-pay | Admitting: Hematology & Oncology

## 2017-10-11 ENCOUNTER — Other Ambulatory Visit: Payer: Self-pay

## 2017-10-11 ENCOUNTER — Other Ambulatory Visit (HOSPITAL_BASED_OUTPATIENT_CLINIC_OR_DEPARTMENT_OTHER): Payer: Self-pay

## 2017-10-11 ENCOUNTER — Telehealth: Payer: Self-pay | Admitting: Hematology & Oncology

## 2017-10-11 ENCOUNTER — Ambulatory Visit (HOSPITAL_BASED_OUTPATIENT_CLINIC_OR_DEPARTMENT_OTHER): Payer: Self-pay | Admitting: Hematology & Oncology

## 2017-10-11 DIAGNOSIS — I82512 Chronic embolism and thrombosis of left femoral vein: Secondary | ICD-10-CM | POA: Insufficient documentation

## 2017-10-11 DIAGNOSIS — I2699 Other pulmonary embolism without acute cor pulmonale: Secondary | ICD-10-CM

## 2017-10-11 DIAGNOSIS — I82412 Acute embolism and thrombosis of left femoral vein: Secondary | ICD-10-CM

## 2017-10-11 DIAGNOSIS — I87009 Postthrombotic syndrome without complications of unspecified extremity: Secondary | ICD-10-CM

## 2017-10-11 DIAGNOSIS — D6862 Lupus anticoagulant syndrome: Secondary | ICD-10-CM

## 2017-10-11 LAB — CBC WITH DIFFERENTIAL (CANCER CENTER ONLY)
BASO#: 0 10*3/uL (ref 0.0–0.2)
BASO%: 0.1 % (ref 0.0–2.0)
EOS%: 2.1 % (ref 0.0–7.0)
Eosinophils Absolute: 0.2 10*3/uL (ref 0.0–0.5)
HCT: 42.3 % (ref 34.8–46.6)
HEMOGLOBIN: 14.2 g/dL (ref 11.6–15.9)
LYMPH#: 2.7 10*3/uL (ref 0.9–3.3)
LYMPH%: 35.8 % (ref 14.0–48.0)
MCH: 31 pg (ref 26.0–34.0)
MCHC: 33.6 g/dL (ref 32.0–36.0)
MCV: 92 fL (ref 81–101)
MONO#: 0.6 10*3/uL (ref 0.1–0.9)
MONO%: 7.7 % (ref 0.0–13.0)
NEUT%: 54.3 % (ref 39.6–80.0)
NEUTROS ABS: 4.1 10*3/uL (ref 1.5–6.5)
Platelets: 295 10*3/uL (ref 145–400)
RBC: 4.58 10*6/uL (ref 3.70–5.32)
RDW: 13.9 % (ref 11.1–15.7)
WBC: 7.6 10*3/uL (ref 3.9–10.0)

## 2017-10-11 LAB — CMP (CANCER CENTER ONLY)
ALBUMIN: 3.5 g/dL (ref 3.3–5.5)
ALK PHOS: 105 U/L — AB (ref 26–84)
ALT: 25 U/L (ref 10–47)
AST: 16 U/L (ref 11–38)
BILIRUBIN TOTAL: 0.5 mg/dL (ref 0.20–1.60)
BUN, Bld: 7 mg/dL (ref 7–22)
CO2: 29 meq/L (ref 18–33)
CREATININE: 0.7 mg/dL (ref 0.6–1.2)
Calcium: 9.6 mg/dL (ref 8.0–10.3)
Chloride: 100 mEq/L (ref 98–108)
Glucose, Bld: 83 mg/dL (ref 73–118)
Potassium: 4.2 mEq/L (ref 3.3–4.7)
SODIUM: 144 meq/L (ref 128–145)
TOTAL PROTEIN: 7.5 g/dL (ref 6.4–8.1)

## 2017-10-11 MED ORDER — IOPAMIDOL (ISOVUE-370) INJECTION 76%
100.0000 mL | Freq: Once | INTRAVENOUS | Status: AC | PRN
  Administered 2017-10-11: 100 mL via INTRAVENOUS

## 2017-10-11 MED ORDER — TRAMADOL HCL 50 MG PO TABS: 50.0000 mg | ORAL_TABLET | Freq: Four times a day (QID) | ORAL | 0 refills | Status: DC | PRN

## 2017-10-11 NOTE — Telephone Encounter (Signed)
Pt in the ofc today and has no insurance. We spoke and she advised she has never applied for Medicaid. She is applying for the [place] Program or for [place] ([employer]).  She took the appl and will return it promptly.

## 2017-10-11 NOTE — Progress Notes (Signed)
Hematology and Oncology Follow Up Visit  [name] [id number] Mar 13, 1966 51 y.o. 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  Transient  Current Therapy:    Eliquis 5 mg p.o. twice daily     Interim History:  [name] is back for follow-up.  She is feeling much better.  Her left leg is not as swollen.  Still has some swelling on occasion.  I went ahead and gave her a prescription for a compression stocking-thigh-high-to wear while she is up and about during the daytime.  We did go ahead and do a CT angiogram today.  This did not show any residual pulmonary emboli.  We did a Doppler of her left leg.  She had a minimal nonocclusive thrombus in the left deep femoral vein and left superficial femoral vein.  We did check her hypercoagulable studies.  She was found to have a lupus anticoagulant.  I am not sure if this is a truly clinically significant finding.  We will have to repeat this when we see her back.  Otherwise, she was negative for any other hereditary thrombophilic conditions.  She has had no problems with the Eliquis.  There is no bleeding.  She had no nausea or vomiting.  She has had no change in bowel or bladder habits.  She had a nice Thanksgiving.  She will be busy over Christmas.  She has had no fever.  She has had no chest wall pain.  There is no cough or shortness of breath.  Overall, her performance status is ECOG 0.  Medications:  Current Outpatient Medications:  .  apixaban (ELIQUIS) 5 MG TABS tablet, Take 5 mg by mouth 2 (two) times daily., Disp: , Rfl:  .  buPROPion (WELLBUTRIN SR) 150 MG 12 hr tablet, Take 150 mg by mouth daily. , Disp: , Rfl:  .  gabapentin (NEURONTIN) 800 MG tablet, Take 800 mg by mouth 3 (three) times daily., Disp: , Rfl:  .  traMADol (ULTRAM) 50 MG tablet, Take 1 tablet (50 mg total) by mouth every 6 (six) hours as  needed., Disp: 60 tablet, Rfl: 0 .  trazodone (DESYREL) 300 MG tablet, Take 300 mg by mouth at bedtime., Disp: , Rfl:   Allergies:  Allergies  Allergen Reactions  . Penicillins Swelling and Hives    Can take keflex per the patient (can't afford expensive meds like vantin)  . Sulfamethoxazole Hives    Past Medical History, Surgical history, Social history, and Family History were reviewed and updated.  Review of Systems: Review of Systems  Constitutional: Negative for appetite change, fatigue, fever and unexpected weight change.  HENT:   Negative for lump/mass, mouth sores, sore throat and trouble swallowing.   Respiratory: Negative for cough, hemoptysis and shortness of breath.   Cardiovascular: Negative for leg swelling and palpitations.  Gastrointestinal: Negative for abdominal distention, abdominal pain, blood in stool, constipation, diarrhea, nausea and vomiting.  Genitourinary: Negative for bladder incontinence, dysuria, frequency and hematuria.   Musculoskeletal: Negative for arthralgias, back pain, gait problem and myalgias.  Skin: Negative for itching and rash.  Neurological: Negative for dizziness, extremity weakness, gait problem, headaches, numbness, seizures and speech difficulty.  Hematological: Does not bruise/bleed easily.  Psychiatric/Behavioral: Negative for depression and sleep disturbance. The patient is not nervous/anxious.     Physical Exam:  weight is 197 lb (89.4 kg). Her oral temperature is 97.6 F (36.4  C). Her blood pressure is 127/73 and her pulse is 63. Her respiration is 20 and oxygen saturation is 100%.   Wt Readings from Last 3 Encounters:  10/11/17 197 lb (89.4 kg)  09/04/17 199 lb (90.3 kg)  09/19/15 173 lb (78.5 kg)    Physical Exam  Constitutional: She is oriented to person, place, and time.  HENT:  Head: Normocephalic and atraumatic.  Mouth/Throat: Oropharynx is clear and moist.  Eyes: EOM are normal. Pupils are equal, round, and reactive  to light.  Neck: Normal range of motion.  Cardiovascular: Normal rate, regular rhythm and normal heart sounds.  Pulmonary/Chest: Effort normal and breath sounds normal.  Abdominal: Soft. Bowel sounds are normal.  Musculoskeletal: Normal range of motion. She exhibits no edema, tenderness or deformity.  Lymphadenopathy:    She has no cervical adenopathy.  Neurological: She is alert and oriented to person, place, and time.  Skin: Skin is warm and dry. No rash noted. No erythema.  Psychiatric: She has a normal mood and affect. Her behavior is normal. Judgment and thought content normal.  Vitals reviewed.    Lab Results  Component Value Date   WBC 7.6 10/11/2017   HGB 14.2 10/11/2017   HCT 42.3 10/11/2017   MCV 92 10/11/2017   PLT 295 10/11/2017     Chemistry      Component Value Date/Time   NA 144 10/11/2017 1155   K 4.2 10/11/2017 1155   CL 100 10/11/2017 1155   CO2 29 10/11/2017 1155   BUN 7 10/11/2017 1155   CREATININE 0.7 10/11/2017 1155      Component Value Date/Time   CALCIUM 9.6 10/11/2017 1155   ALKPHOS 105 (H) 10/11/2017 1155   AST 16 10/11/2017 1155   ALT 25 10/11/2017 1155   BILITOT 0.50 10/11/2017 1155         Impression and Plan: [name] is a 51 year old white female.  She had a pulmonary emboli-bilateral-and left lower extremity thrombus.  This happened after she fell.  She required surgery for her left ankle.  Everything is improving nicely.  She has no residual emboli in her lungs.  I am not sure she really has any residual thrombus in her left leg.  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  Hopefully, the compression stocking will help.  I am still not sure this lupus anticoagulant is truly positive.  We will see what it is when we get her back.  I would like to repeat a Doppler of her left leg when we see her back.  We will get her back in 3 months.     Volanda Napoleon, MD 12/5/20181:05 PM

## 2017-10-12 LAB — D-DIMER, QUANTITATIVE: D-DIMER: 0.29 mg/L FEU (ref 0.00–0.49)

## 2018-01-12 ENCOUNTER — Encounter: Payer: Self-pay | Admitting: Hematology & Oncology

## 2018-01-12 ENCOUNTER — Ambulatory Visit: Payer: Self-pay | Admitting: Hematology & Oncology

## 2018-01-12 ENCOUNTER — Ambulatory Visit (HOSPITAL_BASED_OUTPATIENT_CLINIC_OR_DEPARTMENT_OTHER): Payer: Self-pay

## 2018-01-12 ENCOUNTER — Inpatient Hospital Stay: Payer: Self-pay | Attending: Hematology & Oncology | Admitting: Hematology & Oncology

## 2018-01-12 ENCOUNTER — Other Ambulatory Visit: Payer: Self-pay

## 2018-01-12 ENCOUNTER — Ambulatory Visit (HOSPITAL_BASED_OUTPATIENT_CLINIC_OR_DEPARTMENT_OTHER)
Admission: RE | Admit: 2018-01-12 | Discharge: 2018-01-12 | Disposition: A | Discharge status: Home / Self Care | Payer: Self-pay | Source: Ambulatory Visit | Attending: Hematology & Oncology | Admitting: Hematology & Oncology

## 2018-01-12 DIAGNOSIS — F419 Anxiety disorder, unspecified: Secondary | ICD-10-CM | POA: Insufficient documentation

## 2018-01-12 DIAGNOSIS — Z86711 Personal history of pulmonary embolism: Secondary | ICD-10-CM | POA: Insufficient documentation

## 2018-01-12 DIAGNOSIS — Z86718 Personal history of other venous thrombosis and embolism: Secondary | ICD-10-CM | POA: Insufficient documentation

## 2018-01-12 DIAGNOSIS — Z7901 Long term (current) use of anticoagulants: Secondary | ICD-10-CM | POA: Insufficient documentation

## 2018-01-12 DIAGNOSIS — Z79899 Other long term (current) drug therapy: Secondary | ICD-10-CM | POA: Insufficient documentation

## 2018-01-12 DIAGNOSIS — D6862 Lupus anticoagulant syndrome: Secondary | ICD-10-CM | POA: Insufficient documentation

## 2018-01-12 DIAGNOSIS — R05 Cough: Secondary | ICD-10-CM | POA: Insufficient documentation

## 2018-01-12 DIAGNOSIS — R079 Chest pain, unspecified: Secondary | ICD-10-CM | POA: Insufficient documentation

## 2018-01-12 DIAGNOSIS — R918 Other nonspecific abnormal finding of lung field: Secondary | ICD-10-CM | POA: Insufficient documentation

## 2018-01-12 DIAGNOSIS — I2699 Other pulmonary embolism without acute cor pulmonale: Secondary | ICD-10-CM

## 2018-01-12 DIAGNOSIS — F17210 Nicotine dependence, cigarettes, uncomplicated: Secondary | ICD-10-CM | POA: Insufficient documentation

## 2018-01-12 MED ORDER — ACETAMINOPHEN 500 MG PO TABS
1000.00 | ORAL_TABLET | ORAL | Status: DC
Start: 2018-01-11 — End: 2018-01-12

## 2018-01-12 MED ORDER — PANTOPRAZOLE SODIUM 40 MG PO TBEC
40.00 | DELAYED_RELEASE_TABLET | ORAL | Status: DC
Start: 2018-01-11 — End: 2018-01-12

## 2018-01-12 MED ORDER — SENNOSIDES-DOCUSATE SODIUM 8.6-50 MG PO TABS
1.00 | ORAL_TABLET | ORAL | Status: DC
Start: ? — End: 2018-01-12

## 2018-01-12 MED ORDER — ALPRAZOLAM 0.5 MG PO TABS
1.00 | ORAL_TABLET | ORAL | Status: DC
Start: ? — End: 2018-01-12

## 2018-01-12 MED ORDER — NICOTINE 7 MG/24HR TD PT24
1.00 | MEDICATED_PATCH | TRANSDERMAL | Status: DC
Start: 2018-01-12 — End: 2018-01-12

## 2018-01-12 MED ORDER — IPRATROPIUM BROMIDE 0.02 % IN SOLN
0.50 | RESPIRATORY_TRACT | Status: DC
Start: ? — End: 2018-01-12

## 2018-01-12 MED ORDER — ALBUTEROL SULFATE HFA 108 (90 BASE) MCG/ACT IN AERS
2.00 | INHALATION_SPRAY | RESPIRATORY_TRACT | Status: DC
Start: ? — End: 2018-01-12

## 2018-01-12 MED ORDER — TRAMADOL HCL 50 MG PO TABS
50.00 | ORAL_TABLET | ORAL | Status: DC
Start: ? — End: 2018-01-12

## 2018-01-12 MED ORDER — GABAPENTIN 400 MG PO CAPS
800.00 | ORAL_CAPSULE | ORAL | Status: DC
Start: 2018-01-11 — End: 2018-01-12

## 2018-01-12 MED ORDER — APIXABAN 5 MG PO TABS
5.00 | ORAL_TABLET | ORAL | Status: DC
Start: 2018-01-11 — End: 2018-01-12

## 2018-01-12 MED ORDER — ALPRAZOLAM 1 MG PO TABS: 1.0000 mg | ORAL_TABLET | Freq: Every day | ORAL | 0 refills | Status: AC

## 2018-01-12 MED ORDER — POLYETHYLENE GLYCOL 3350 17 G PO PACK
17.00 g | PACK | ORAL | Status: DC
Start: 2018-01-12 — End: 2018-01-12

## 2018-01-12 MED ORDER — TRAZODONE HCL 100 MG PO TABS
300.00 | ORAL_TABLET | ORAL | Status: DC
Start: 2018-01-11 — End: 2018-01-12

## 2018-01-12 MED ORDER — HYDROCODONE-ACETAMINOPHEN 5-325 MG PO TABS
1.0000 | ORAL_TABLET | Freq: Two times a day (BID) | ORAL | 0 refills | Status: AC | PRN
Start: 2018-01-12 — End: ?

## 2018-01-12 NOTE — Progress Notes (Signed)
Hematology and Oncology Follow Up Visit  [name] [id number] 11/30/1965 52 y.o. 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 twice daily -maintenance therapy started on 01/12/2018     Interim History:  [name] is back for follow-up.  Unfortunately, we have a new problem.  She apparently was having some chest pain.  She had a little bit of a cough.  There is no hemoptysis.  She had no fever.  She went to the emergency room at [facility].  She had a CT angiogram done.  Surprisingly enough, she was found to have a mass in the right lung.  This measured 2.3 x 2.9 x 3.1 cm.  It was along the posterior aspect of the bronchus intermedius.  There is no obvious axillary adenopathy.  She had a 9 mm low right paratracheal and right hilar lymph node.  Of note, she has not smoked.  She probably has a 50-pack-year history of tobacco use.  She is trying to cut back.  It appears that she was admitted to the hospital.  She underwent a bronchoscopy.  Bronchoscopy was done on 01/09/2018.  She says that she is going to be sent to [facility] for a bronchoscopy.  It sounds like this is going to be electromagnetic bronchoscopy or EBUS.    We did do a Doppler of her left leg today.  Thankfully, the Doppler did not show any residual thrombus.  She is on Eliquis at 5 mg.  She has been on 5 mg for 6 months.  I will now cut her down to 2.5 mg twice daily.  She obviously is quite nervous and anxious.  She was given some Xanax in the hospital.  Unfortunately, she did not get any Xanax as an outpatient.  We will go ahead and give her some.  She is having some chest discomfort.  This is where the tumor is located.  I went ahead and called in some Vicodin for her.  She is not sure when the  bronchoscopy is going to be done at [facility].    She says that she wants to come to our office for treatment.  We have established a very good relationship with her.  She just feels very confident with our care.  Overall, I said that her performance status is ECOG 1.  Medications:  Current Outpatient Medications:  .  ALPRAZolam (XANAX) 1 MG tablet, Take 1 mg by mouth daily., Disp: , Rfl:  .  apixaban (ELIQUIS) 5 MG TABS tablet, Take 5 mg by mouth 2 (two) times daily., Disp: , Rfl:  .  buPROPion (WELLBUTRIN SR) 150 MG 12 hr tablet, Take 150 mg by mouth daily. , Disp: , Rfl:  .  gabapentin (NEURONTIN) 400 MG capsule, Take 400 mg by mouth daily., Disp: , Rfl:  .  traMADol (ULTRAM) 50 MG tablet, Take 1 tablet (50 mg total) by mouth every 6 (six) hours as needed., Disp: 60 tablet, Rfl: 0 .  trazodone (DESYREL) 300 MG tablet, Take 300 mg by mouth at bedtime., Disp: , Rfl:   Allergies:  Allergies  Allergen Reactions  . Penicillins Swelling and Hives    Can take keflex per the patient (can't afford expensive meds like vantin) Can take keflex per the patient (can't afford  expensive meds like vantin)  . Sulfamethoxazole Hives    Past Medical History, Surgical history, Social history, and Family History were reviewed and updated.  Review of Systems: Review of Systems  Constitutional: Positive for fatigue. Negative for appetite change, fever and unexpected weight change.  HENT:   Negative for lump/mass, mouth sores, sore throat and trouble swallowing.   Respiratory: Positive for cough and shortness of breath. Negative for hemoptysis.   Cardiovascular: Positive for chest pain. Negative for leg swelling and palpitations.  Gastrointestinal: Negative for abdominal distention, abdominal pain, blood in stool, constipation, diarrhea, nausea and vomiting.  Genitourinary: Negative for bladder incontinence, dysuria, frequency and hematuria.   Musculoskeletal: Positive for myalgias. Negative for  arthralgias, back pain and gait problem.  Skin: Negative for itching and rash.  Neurological: Negative for dizziness, extremity weakness, gait problem, headaches, numbness, seizures and speech difficulty.  Hematological: Does not bruise/bleed easily.  Psychiatric/Behavioral: Negative for depression and sleep disturbance. The patient is nervous/anxious.     Physical Exam:  weight is 191 lb (86.6 kg). Her oral temperature is 98.1 F (36.7 C). Her blood pressure is 121/61 and her pulse is 75. Her respiration is 20 and oxygen saturation is 100%.   Wt Readings from Last 3 Encounters:  01/12/18 191 lb (86.6 kg)  10/11/17 197 lb (89.4 kg)  09/04/17 199 lb (90.3 kg)    Physical Exam  Constitutional: She is oriented to person, place, and time.  HENT:  Head: Normocephalic and atraumatic.  Mouth/Throat: Oropharynx is clear and moist.  Eyes: EOM are normal. Pupils are equal, round, and reactive to light.  Neck: Normal range of motion.  Cardiovascular: Normal rate, regular rhythm and normal heart sounds.  Pulmonary/Chest: Effort normal and breath sounds normal.  Abdominal: Soft. Bowel sounds are normal.  Musculoskeletal: Normal range of motion. She exhibits no edema, tenderness or deformity.  Lymphadenopathy:    She has no cervical adenopathy.  Neurological: She is alert and oriented to person, place, and time.  Skin: Skin is warm and dry. No rash noted. No erythema.  Psychiatric: She has a normal mood and affect. Her behavior is normal. Judgment and thought content normal.  Vitals reviewed.    Lab Results  Component Value Date   WBC 7.6 10/11/2017   HGB 14.2 10/11/2017   HCT 42.3 10/11/2017   MCV 92 10/11/2017   PLT 295 10/11/2017     Chemistry      Component Value Date/Time   NA 144 10/11/2017 1155   K 4.2 10/11/2017 1155   CL 100 10/11/2017 1155   CO2 29 10/11/2017 1155   BUN 7 10/11/2017 1155   CREATININE 0.7 10/11/2017 1155      Component Value Date/Time   CALCIUM 9.6  10/11/2017 1155   ALKPHOS 105 (H) 10/11/2017 1155   AST 16 10/11/2017 1155   ALT 25 10/11/2017 1155   BILITOT 0.50 10/11/2017 1155         Impression and Plan: [name] is a 52 year old white female.  She had a pulmonary emboli-bilateral-and left lower extremity thrombus.  This happened after she fell.  She required surgery for her left ankle.  Everything is improving nicely.  She has no residual emboli in her lungs.  I am not sure she really has any residual thrombus in her left leg.  Again, I have to believe that she is going to have a bronchogenic carcinoma.  This really would not surprise me.  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  The stage of this tumor is unclear.  The PET scan will help [place] out with this.  She said a PET scan will be set up for the next week or so.  If she is not a surgical candidate, she would definitely be a candidate for chemotherapy and radiation therapy.  I spent about an hour with she and her husband.  Over 50% of the time was spent face-to-face with them.  I was going over the CAT scan that she had at [facility].  I was telling them my recommendations.  She is very thankful that we will be able to take her on as a cancer patient.  She just feels very comfortable in our office.  I will plan to see her back once we have the results back from her biopsy in her PET scan.Marland Kitchen     Volanda Napoleon, MD 3/8/20191:22 PM

## 2020-01-31 ENCOUNTER — Emergency Department (HOSPITAL_BASED_OUTPATIENT_CLINIC_OR_DEPARTMENT_OTHER): Payer: Medicaid Other

## 2020-01-31 ENCOUNTER — Emergency Department (HOSPITAL_BASED_OUTPATIENT_CLINIC_OR_DEPARTMENT_OTHER)
Admission: EM | Admit: 2020-01-31 | Discharge: 2020-01-31 | Disposition: A | Discharge status: Home / Self Care | Payer: Medicaid Other | Attending: Emergency Medicine | Admitting: Emergency Medicine

## 2020-01-31 ENCOUNTER — Other Ambulatory Visit: Payer: Self-pay

## 2020-01-31 ENCOUNTER — Encounter (HOSPITAL_BASED_OUTPATIENT_CLINIC_OR_DEPARTMENT_OTHER): Payer: Self-pay

## 2020-01-31 DIAGNOSIS — Z87442 Personal history of urinary calculi: Secondary | ICD-10-CM | POA: Insufficient documentation

## 2020-01-31 DIAGNOSIS — Z85118 Personal history of other malignant neoplasm of bronchus and lung: Secondary | ICD-10-CM | POA: Diagnosis not present

## 2020-01-31 DIAGNOSIS — R509 Fever, unspecified: Secondary | ICD-10-CM | POA: Diagnosis not present

## 2020-01-31 DIAGNOSIS — J441 Chronic obstructive pulmonary disease with (acute) exacerbation: Secondary | ICD-10-CM | POA: Insufficient documentation

## 2020-01-31 DIAGNOSIS — R0789 Other chest pain: Secondary | ICD-10-CM | POA: Diagnosis not present

## 2020-01-31 DIAGNOSIS — F17210 Nicotine dependence, cigarettes, uncomplicated: Secondary | ICD-10-CM | POA: Diagnosis not present

## 2020-01-31 DIAGNOSIS — R1031 Right lower quadrant pain: Secondary | ICD-10-CM | POA: Diagnosis not present

## 2020-01-31 DIAGNOSIS — R05 Cough: Secondary | ICD-10-CM | POA: Diagnosis present

## 2020-01-31 HISTORY — DX: Malignant neoplasm of unspecified part of unspecified bronchus or lung: C34.90

## 2020-01-31 LAB — CBC WITH DIFFERENTIAL/PLATELET
Abs Immature Granulocytes: 0.05 10*3/uL (ref 0.00–0.07)
Basophils Absolute: 0 10*3/uL (ref 0.0–0.1)
Basophils Relative: 0 %
Eosinophils Absolute: 0.1 10*3/uL (ref 0.0–0.5)
Eosinophils Relative: 1 %
HCT: 40.9 % (ref 36.0–46.0)
Hemoglobin: 13.4 g/dL (ref 12.0–15.0)
Immature Granulocytes: 0 %
Lymphocytes Relative: 19 %
Lymphs Abs: 2.3 10*3/uL (ref 0.7–4.0)
MCH: 29.8 pg (ref 26.0–34.0)
MCHC: 32.8 g/dL (ref 30.0–36.0)
MCV: 91.1 fL (ref 80.0–100.0)
Monocytes Absolute: 0.8 10*3/uL (ref 0.1–1.0)
Monocytes Relative: 6 %
Neutro Abs: 8.9 10*3/uL — ABNORMAL HIGH (ref 1.7–7.7)
Neutrophils Relative %: 74 %
Platelets: 242 10*3/uL (ref 150–400)
RBC: 4.49 MIL/uL (ref 3.87–5.11)
RDW: 13.7 % (ref 11.5–15.5)
WBC: 12.1 10*3/uL — ABNORMAL HIGH (ref 4.0–10.5)
nRBC: 0 % (ref 0.0–0.2)

## 2020-01-31 LAB — COMPREHENSIVE METABOLIC PANEL
ALT: 15 U/L (ref 0–44)
AST: 17 U/L (ref 15–41)
Albumin: 3.6 g/dL (ref 3.5–5.0)
Alkaline Phosphatase: 76 U/L (ref 38–126)
Anion gap: 9 (ref 5–15)
BUN: 12 mg/dL (ref 6–20)
CO2: 26 mmol/L (ref 22–32)
Calcium: 8.9 mg/dL (ref 8.9–10.3)
Chloride: 101 mmol/L (ref 98–111)
Creatinine, Ser: 0.98 mg/dL (ref 0.44–1.00)
GFR calc Af Amer: >60 mL/min (ref >60)
GFR calc non Af Amer: >60 mL/min (ref >60)
Glucose, Bld: 105 mg/dL — ABNORMAL HIGH (ref 70–99)
Potassium: 3.9 mmol/L (ref 3.5–5.1)
Sodium: 136 mmol/L (ref 135–145)
Total Bilirubin: 0.6 mg/dL (ref 0.3–1.2)
Total Protein: 7.3 g/dL (ref 6.5–8.1)

## 2020-01-31 LAB — URINALYSIS, ROUTINE W REFLEX MICROSCOPIC
Bilirubin Urine: NEGATIVE
Glucose, UA: NEGATIVE mg/dL
Ketones, ur: NEGATIVE mg/dL
Leukocytes,Ua: NEGATIVE
Nitrite: NEGATIVE
Protein, ur: NEGATIVE mg/dL
Specific Gravity, Urine: >1.03 — ABNORMAL HIGH (ref 1.005–1.030)
pH: 5.5 (ref 5.0–8.0)

## 2020-01-31 LAB — URINALYSIS, MICROSCOPIC (REFLEX)

## 2020-01-31 LAB — LIPASE, BLOOD: Lipase: 17 U/L (ref 11–51)

## 2020-01-31 MED ORDER — ONDANSETRON HCL 4 MG/2ML IJ SOLN
4.0000 mg | Freq: Once | INTRAMUSCULAR | Status: AC
  Administered 2020-01-31: 4 mg via INTRAVENOUS

## 2020-01-31 MED ORDER — SODIUM CHLORIDE 0.9 % IV SOLN
1000.0000 mL | INTRAVENOUS | Status: DC
  Administered 2020-01-31: 1000 mL via INTRAVENOUS

## 2020-01-31 MED ORDER — SODIUM CHLORIDE 0.9 % IV BOLUS (SEPSIS)
500.0000 mL | Freq: Once | INTRAVENOUS | Status: AC
  Administered 2020-01-31: 500 mL via INTRAVENOUS

## 2020-01-31 MED ORDER — ONDANSETRON HCL 4 MG/2ML IJ SOLN
INTRAMUSCULAR | Status: AC
  Filled 2020-01-31: qty 2

## 2020-01-31 MED ORDER — PREDNISONE 50 MG PO TABS: 50.0000 mg | ORAL_TABLET | Freq: Every day | ORAL | 0 refills | Status: AC

## 2020-01-31 MED ORDER — ONDANSETRON HCL 4 MG/2ML IJ SOLN
4.0000 mg | Freq: Once | INTRAMUSCULAR | Status: AC
  Administered 2020-01-31: 4 mg via INTRAVENOUS
  Filled 2020-01-31: qty 2

## 2020-01-31 MED ORDER — KETOROLAC TROMETHAMINE 30 MG/ML IJ SOLN
15.0000 mg | Freq: Once | INTRAMUSCULAR | Status: AC
  Administered 2020-01-31: 15:00:00 15 mg via INTRAVENOUS
  Filled 2020-01-31: qty 1

## 2020-01-31 MED ORDER — ALBUTEROL SULFATE HFA 108 (90 BASE) MCG/ACT IN AERS
4.0000 | INHALATION_SPRAY | Freq: Once | RESPIRATORY_TRACT | Status: AC
Start: 2020-01-31 — End: 2020-01-31
  Administered 2020-01-31: 4 via RESPIRATORY_TRACT
  Filled 2020-01-31: qty 6.7

## 2020-01-31 MED ORDER — FENTANYL CITRATE (PF) 100 MCG/2ML IJ SOLN
50.0000 ug | Freq: Once | INTRAMUSCULAR | Status: AC
  Administered 2020-01-31: 50 ug via INTRAVENOUS
  Filled 2020-01-31: qty 2

## 2020-01-31 MED ORDER — AZITHROMYCIN 250 MG PO TABS: ORAL_TABLET | ORAL | 0 refills | Status: AC

## 2020-01-31 NOTE — ED Triage Notes (Signed)
Pt c/o cough, fever, abd pain, back pain-sx x 2 weeks-seen by PCP 1 week ago with neg covid and neg flu-UA with no results to pt-NAD-to triage in w/c

## 2020-01-31 NOTE — Discharge Instructions (Addendum)
Urinalysis was negative.  Take the antibiotic Zithromax and the prednisone as directed.  For the COPD exacerbation.  Would expect improvement over the next few days.  Return for any new or worse symptoms.

## 2020-01-31 NOTE — ED Provider Notes (Signed)
[place] EMERGENCY DEPARTMENT Provider Note   CSN: [id number] Arrival date & time: 01/31/20  1301     History Chief Complaint  Patient presents with  . Cough    [name] is a 54 y.o. female.  HPI   Patient presents to the emergency room for evaluation of cough, fever, flank pain and URI symptoms.  Patient states the symptoms started a couple weeks ago.  She tried over-the-counter medications.  She went to see her primary care doctor about a week ago.  She had a flu test, Covid test and a urinalysis.  Those tests were all negative.  Patient states she continues to have the symptoms.  She gets sharp pain in her chest with coughing.  She also has pain in her right flank area.  Patient states she has history of kidney stones and is concerned she might have another one.  Patient does have history of prior lung cancer.  She states she only has one lung now.  She occasionally smokes but has cut down significantly.  She is not having any dyspnea.  Past Medical History:  Diagnosis Date  . Bipolar depression ([place])   . Lung cancer [facility])     Patient Active Problem List   Diagnosis Date Noted  . Mass of middle lobe of right lung 01/12/2018  . Pulmonary embolism and infarction ([place]) 09/04/2017    Past Surgical History:  Procedure Laterality Date  . ABDOMINAL HYSTERECTOMY    . LUNG SURGERY    . LYMPH NODE DISSECTION       OB History   No obstetric history on file.     No family history on file.  Social History   Tobacco Use  . Smoking status: Current Every Day Smoker    Types: Cigarettes  . Smokeless tobacco: Never Used  Substance Use Topics  . Alcohol use: No  . Drug use: No    Home Medications Prior to Admission medications   Medication Sig Start Date End Date Taking? Authorizing Provider  apixaban (ELIQUIS) 5 MG TABS tablet Take 5 mg by mouth 2 (two) times daily.    [provider]  azithromycin (ZITHROMAX Z-PAK) 250 MG tablet Take 2  tabs by mouth on day 1 then 1 tab by mouth daily on days 2-5 01/31/20   Dorie Rank, MD  buPROPion [facility] SR) 150 MG 12 hr tablet Take 150 mg by mouth daily.     [provider]  gabapentin (NEURONTIN) 400 MG capsule Take 400 mg by mouth daily.    [provider]  HYDROcodone-acetaminophen (NORCO/VICODIN) 5-325 MG tablet Take 1 tablet by mouth every 12 (twelve) hours as needed for moderate pain. 01/12/18   Volanda Napoleon, MD  predniSONE (DELTASONE) 50 MG tablet Take 1 tablet (50 mg total) by mouth daily. 01/31/20   Dorie Rank, MD  trazodone (DESYREL) 300 MG tablet Take 300 mg by mouth at bedtime.    [provider]    Allergies    Penicillins, Sulfamethoxazole, and Morphine and related  Review of Systems   Review of Systems  All other systems reviewed and are negative.   Physical Exam Updated Vital Signs BP 115/72 (BP Location: Right Arm)   Pulse 90   Temp 99.2 F (37.3 C) (Oral)   Resp 20   Ht 1.6 m (5\' 3" )   Wt 89.4 kg   SpO2 99%   BMI 34.90 kg/m   Physical Exam Vitals and nursing note reviewed.  Constitutional:  General: She is not in acute distress.    Appearance: She is well-developed.  HENT:     Head: Normocephalic and atraumatic.     Right Ear: External ear normal.     Left Ear: External ear normal.  Eyes:     General: No scleral icterus.       Right eye: No discharge.        Left eye: No discharge.     Conjunctiva/sclera: Conjunctivae normal.  Neck:     Trachea: No tracheal deviation.  Cardiovascular:     Rate and Rhythm: Normal rate and regular rhythm.  Pulmonary:     Effort: Pulmonary effort is normal. No respiratory distress.     Breath sounds: No stridor. Wheezing present. No rales.  Abdominal:     General: Bowel sounds are normal. There is no distension.     Palpations: Abdomen is soft.     Tenderness: There is no abdominal tenderness. There is no guarding or rebound.     Comments: cvat right  Musculoskeletal:         General: No tenderness.     Cervical back: Neck supple.  Skin:    General: Skin is warm and dry.     Findings: No rash.  Neurological:     Mental Status: She is alert.     Cranial Nerves: No cranial nerve deficit (no facial droop, extraocular movements intact, no slurred speech).     Sensory: No sensory deficit.     Motor: No abnormal muscle tone or seizure activity.     Coordination: Coordination normal.     ED Results / Procedures / Treatments   Labs (all labs ordered are listed, but only abnormal results are displayed) Labs Reviewed  CBC WITH DIFFERENTIAL/PLATELET - Abnormal; Notable for the following components:      Result Value   WBC 12.1 (*)    Neutro Abs 8.9 (*)    All other components within normal limits  COMPREHENSIVE METABOLIC PANEL - Abnormal; Notable for the following components:   Glucose, Bld 105 (*)    All other components within normal limits  LIPASE, BLOOD  URINALYSIS, ROUTINE W REFLEX MICROSCOPIC    EKG None  Radiology DG Chest Portable 1 View  Result Date: 01/31/2020 CLINICAL DATA:  Short of breath.  History lung cancer EXAM: PORTABLE CHEST 1 VIEW COMPARISON:  11/24/2019 FINDINGS: Right pneumonectomy with complete opacification of the right chest unchanged. Volume loss with shift of the mediastinum to the right Left lung well aerated and clear. Negative for heart failure or pneumonia IMPRESSION: Stable right pneumonectomy.  Left lung clear. Electronically Signed   By: Franchot Gallo M.D.   On: 01/31/2020 14:37   CT Renal Stone Study  Result Date: 01/31/2020 CLINICAL DATA:  Flank pain. History of kidney infections. Lung cancer. EXAM: CT ABDOMEN AND PELVIS WITHOUT CONTRAST TECHNIQUE: Multidetector CT imaging of the abdomen and pelvis was performed following the standard protocol without IV contrast. COMPARISON:  CT abdomen pelvis 10/11/2019 FINDINGS: Lower chest: Right pneumonectomy.  Left lung base clear. Hepatobiliary: No focal liver abnormality is seen.  No gallstones, gallbladder wall thickening, or biliary dilatation. Pancreas: Atrophic fatty changes in the pancreas without calcification or edema Spleen: Negative Adrenals/Urinary Tract: 2 mm nonobstructing stone right lower pole unchanged. No renal obstruction or mass. No other renal calculi. Negative urinary bladder. Stomach/Bowel: Stomach is within normal limits. Appendix appears normal. No evidence of bowel wall thickening, distention, or inflammatory changes. Vascular/Lymphatic: Atherosclerotic aorta. No aneurysm. No lymphadenopathy.  Reproductive: Hysterectomy.  No pelvic mass. Other: Negative for free fluid Musculoskeletal: Degenerative changes in the lumbar spine. No acute skeletal abnormality. IMPRESSION: 1. No renal obstruction.  2 mm nonobstructing stone right lower pole 2. Normal appendix Electronically Signed   By: Franchot Gallo M.D.   On: 01/31/2020 15:26    Procedures Procedures (including critical care time)  Medications Ordered in ED Medications  sodium chloride 0.9 % bolus 500 mL (500 mLs Intravenous New Bag/Given 01/31/20 1446)    Followed by  0.9 %  sodium chloride infusion (1,000 mLs Intravenous New Bag/Given 01/31/20 1447)  fentaNYL (SUBLIMAZE) injection 50 mcg (has no administration in time range)  ketorolac (TORADOL) 30 MG/ML injection 15 mg (15 mg Intravenous Given 01/31/20 1440)  ondansetron (ZOFRAN) injection 4 mg (4 mg Intravenous Given 01/31/20 1440)  albuterol (VENTOLIN HFA) 108 (90 Base) MCG/ACT inhaler 4 puff (4 puffs Inhalation Given 01/31/20 1441)    ED Course  I have reviewed the triage vital signs and the nursing notes.  Pertinent labs & imaging results that were available during my care of the patient were reviewed by me and considered in my medical decision making (see chart for details).  Clinical Course as of Jan 31 1535  Fri Jan 31, 2020  1516 Chest x-ray without pneumonia.  Labs pending   [JK]    Clinical Course User Index [JK] Dorie Rank, MD   MDM  Rules/Calculators/A&P                      Patient presents with URI symptoms associated with cough as well as groin pain.  Chest x-ray does not show pneumonia.  Patient is concerned she might have recurrent kidney stone.  CT scan without acute finding. [place] opiate reviewed.   30 day rx [place] film this month.  Plan on dc home with abx.  Prednisone.  UA pending.  Care turned over to Dr Rogene Houston Final Clinical Impression(s) / ED Diagnoses Final diagnoses:  COPD exacerbation ([place])    Rx / DC Orders ED Discharge Orders         Ordered    predniSONE (DELTASONE) 50 MG tablet  Daily     01/31/20 1536    azithromycin (ZITHROMAX Z-PAK) 250 MG tablet     01/31/20 1536           Dorie Rank, MD 01/31/20 1536

## 2021-06-08 IMAGING — CT CT RENAL STONE PROTOCOL
2 of 4 series · 17 of 46 positions shown, 19 images · non-contrast
Comparison: CT abdomen pelvis 10/11/2019

CLINICAL DATA: Flank pain. History of kidney infections. Lung
cancer.

EXAM:
CT ABDOMEN AND PELVIS WITHOUT CONTRAST
TECHNIQUE: Multidetector CT imaging of the abdomen and pelvis was performed
following the standard protocol without IV contrast.

[Series 2: axial st · axial · 0.98mm/px · z∈[+594,+1039]mm · 14 of 99 slices shown, 16 images]
[im 5/99  soft-tissue]
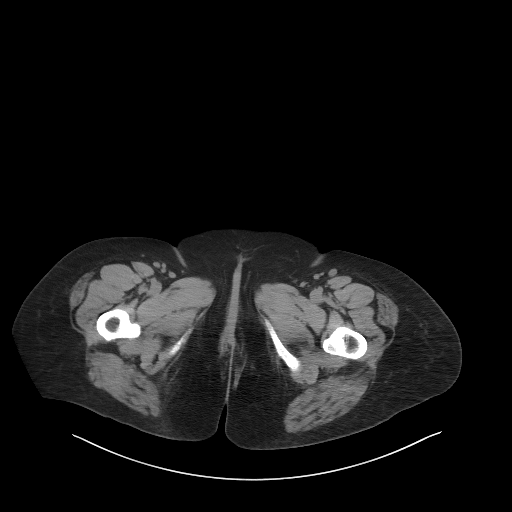
[im 5/99  bone]
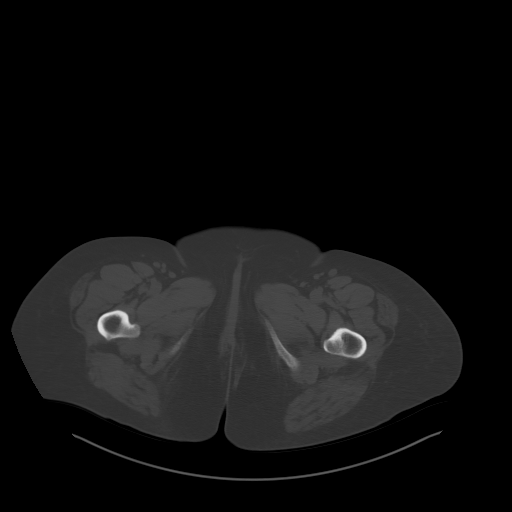
[im 13/99  soft-tissue]
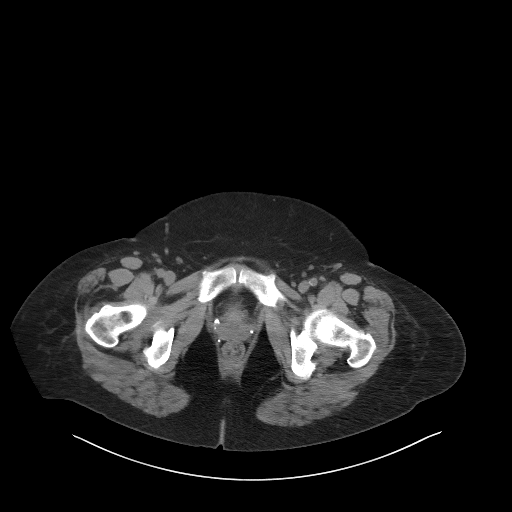
[im 21/99  soft-tissue]
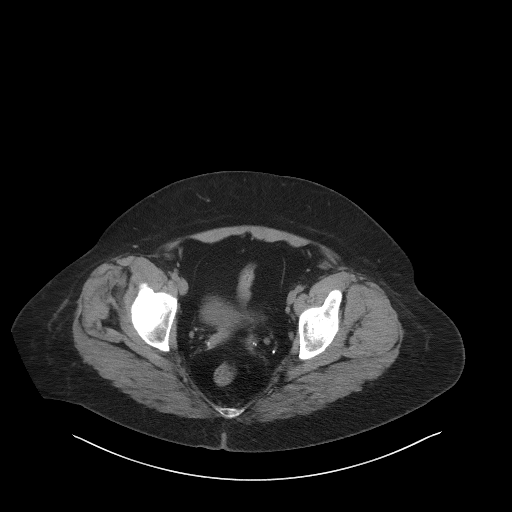
[im 25/99  soft-tissue]
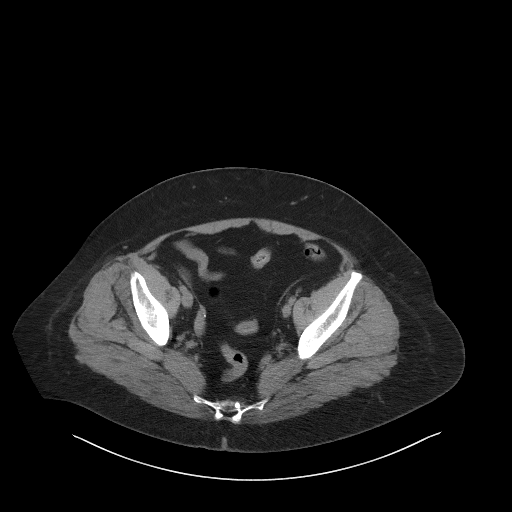
[im 33/99  soft-tissue]
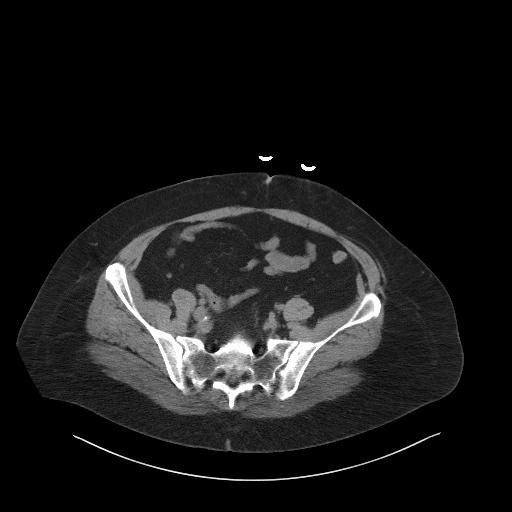
[im 41/99  soft-tissue]
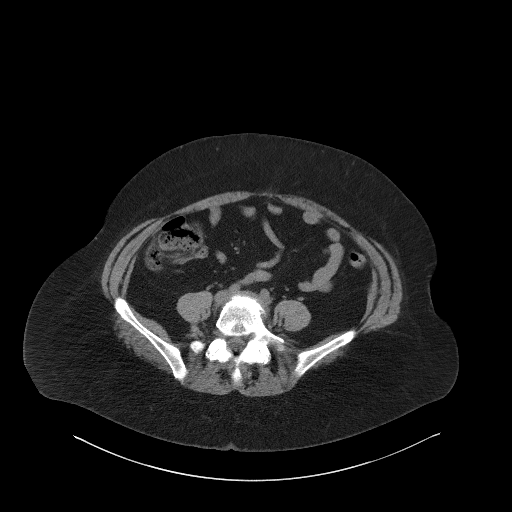
[im 45/99  soft-tissue]
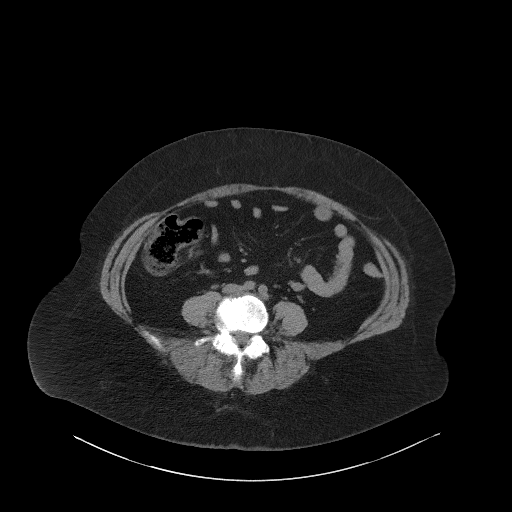
[im 54/99  soft-tissue]
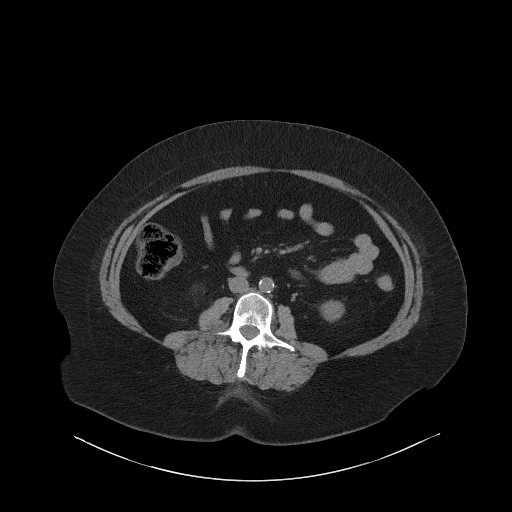
[im 58/99  soft-tissue]
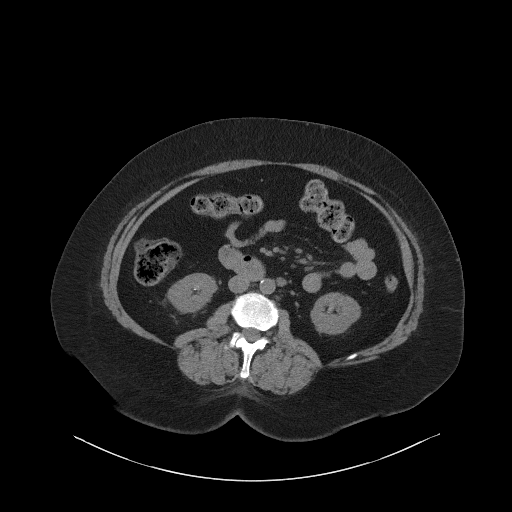
[im 58/99  bone]
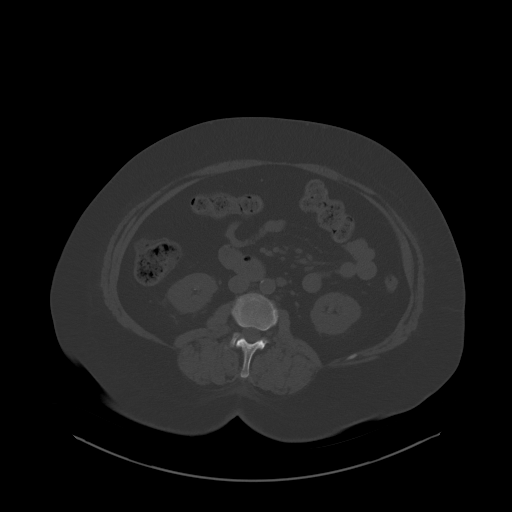
[im 66/99  soft-tissue]
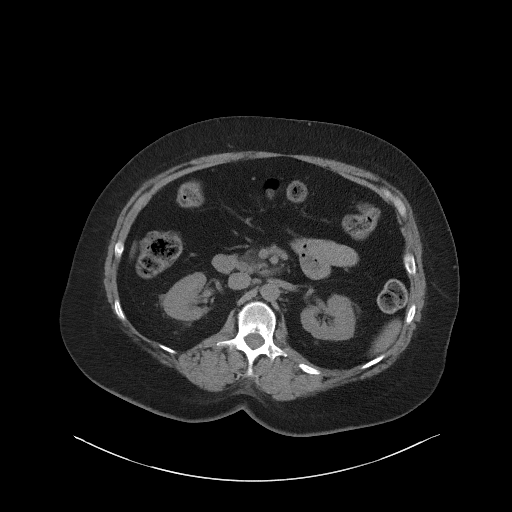
[im 74/99  soft-tissue]
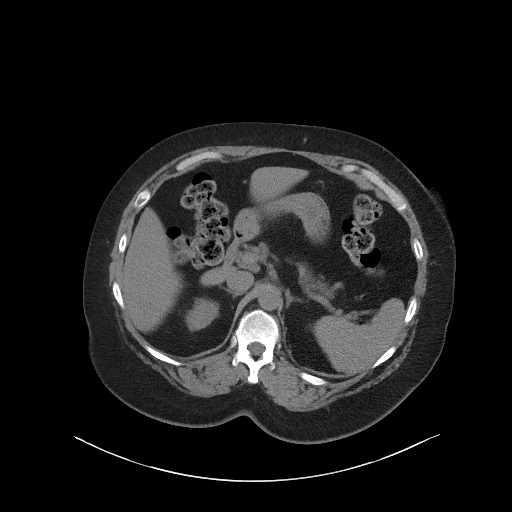
[im 78/99  soft-tissue]
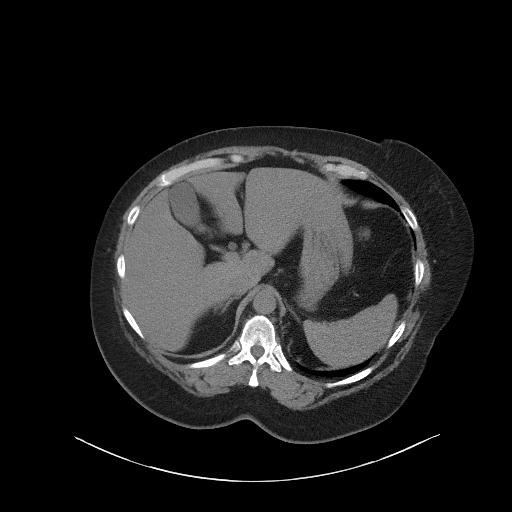
[im 86/99  soft-tissue]
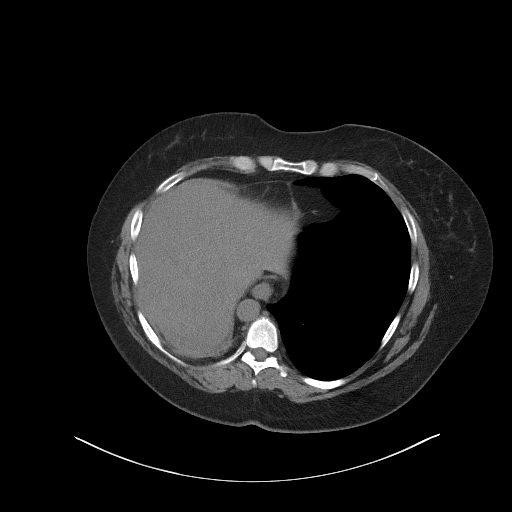
[im 94/99  soft-tissue]
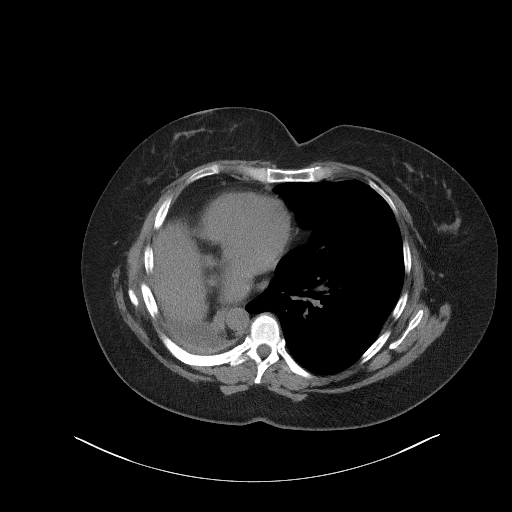

[Series 5: coronal st · coronal · 1.03mm/px · 3 of 101 slices shown]
[im 34/101  soft-tissue]
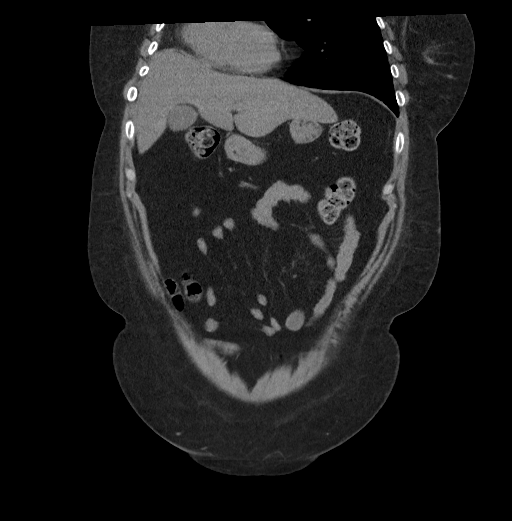
[im 45/101  soft-tissue]
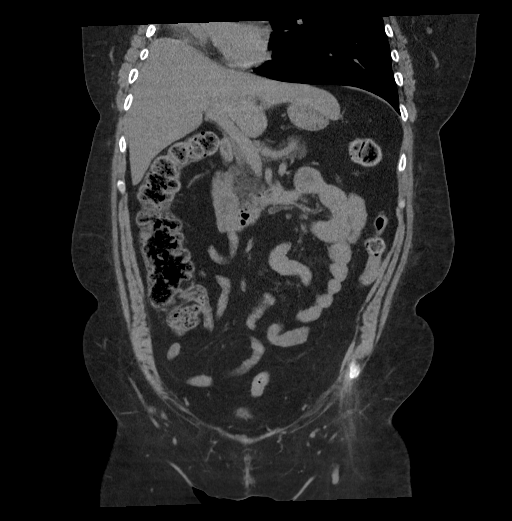
[im 56/101  soft-tissue]
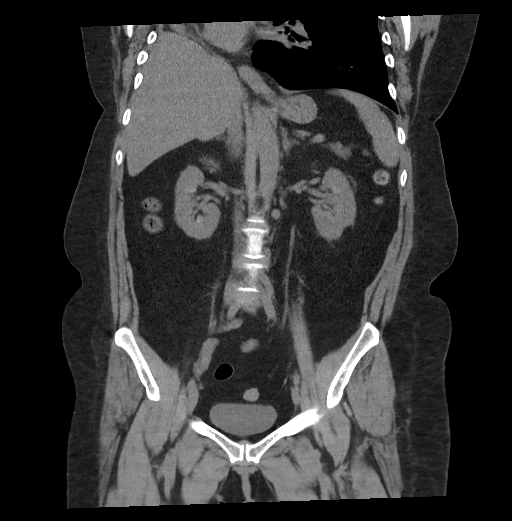

[17 of 46 positions shown; findings below may reference images not displayed]

FINDINGS: Lower chest: Right pneumonectomy.  Left lung base clear.

Hepatobiliary: No focal liver abnormality is seen. No gallstones,
gallbladder wall thickening, or biliary dilatation.

Pancreas: Atrophic fatty changes in the pancreas without
calcification or edema

Spleen: Negative

Adrenals/Urinary Tract: 2 mm nonobstructing stone right lower pole
unchanged. No renal obstruction or mass. No other renal calculi.
Negative urinary bladder.

Stomach/Bowel: Stomach is within normal limits. Appendix appears
normal. No evidence of bowel wall thickening, distention, or
inflammatory changes.

Vascular/Lymphatic: Atherosclerotic aorta. No aneurysm. No
lymphadenopathy.

Reproductive: Hysterectomy.  No pelvic mass.

Other: Negative for free fluid

Musculoskeletal: Degenerative changes in the lumbar spine. No acute
skeletal abnormality.
IMPRESSION: 1. No renal obstruction.  2 mm nonobstructing stone right lower pole
2. Normal appendix

## 2021-06-08 IMAGING — DX DG CHEST 1V PORT
1 series · 1 of 1 positions shown · non-contrast
Comparison: 11/24/2019

CLINICAL DATA: Short of breath.  History lung cancer

EXAM:
PORTABLE CHEST 1 VIEW

[chest ap]
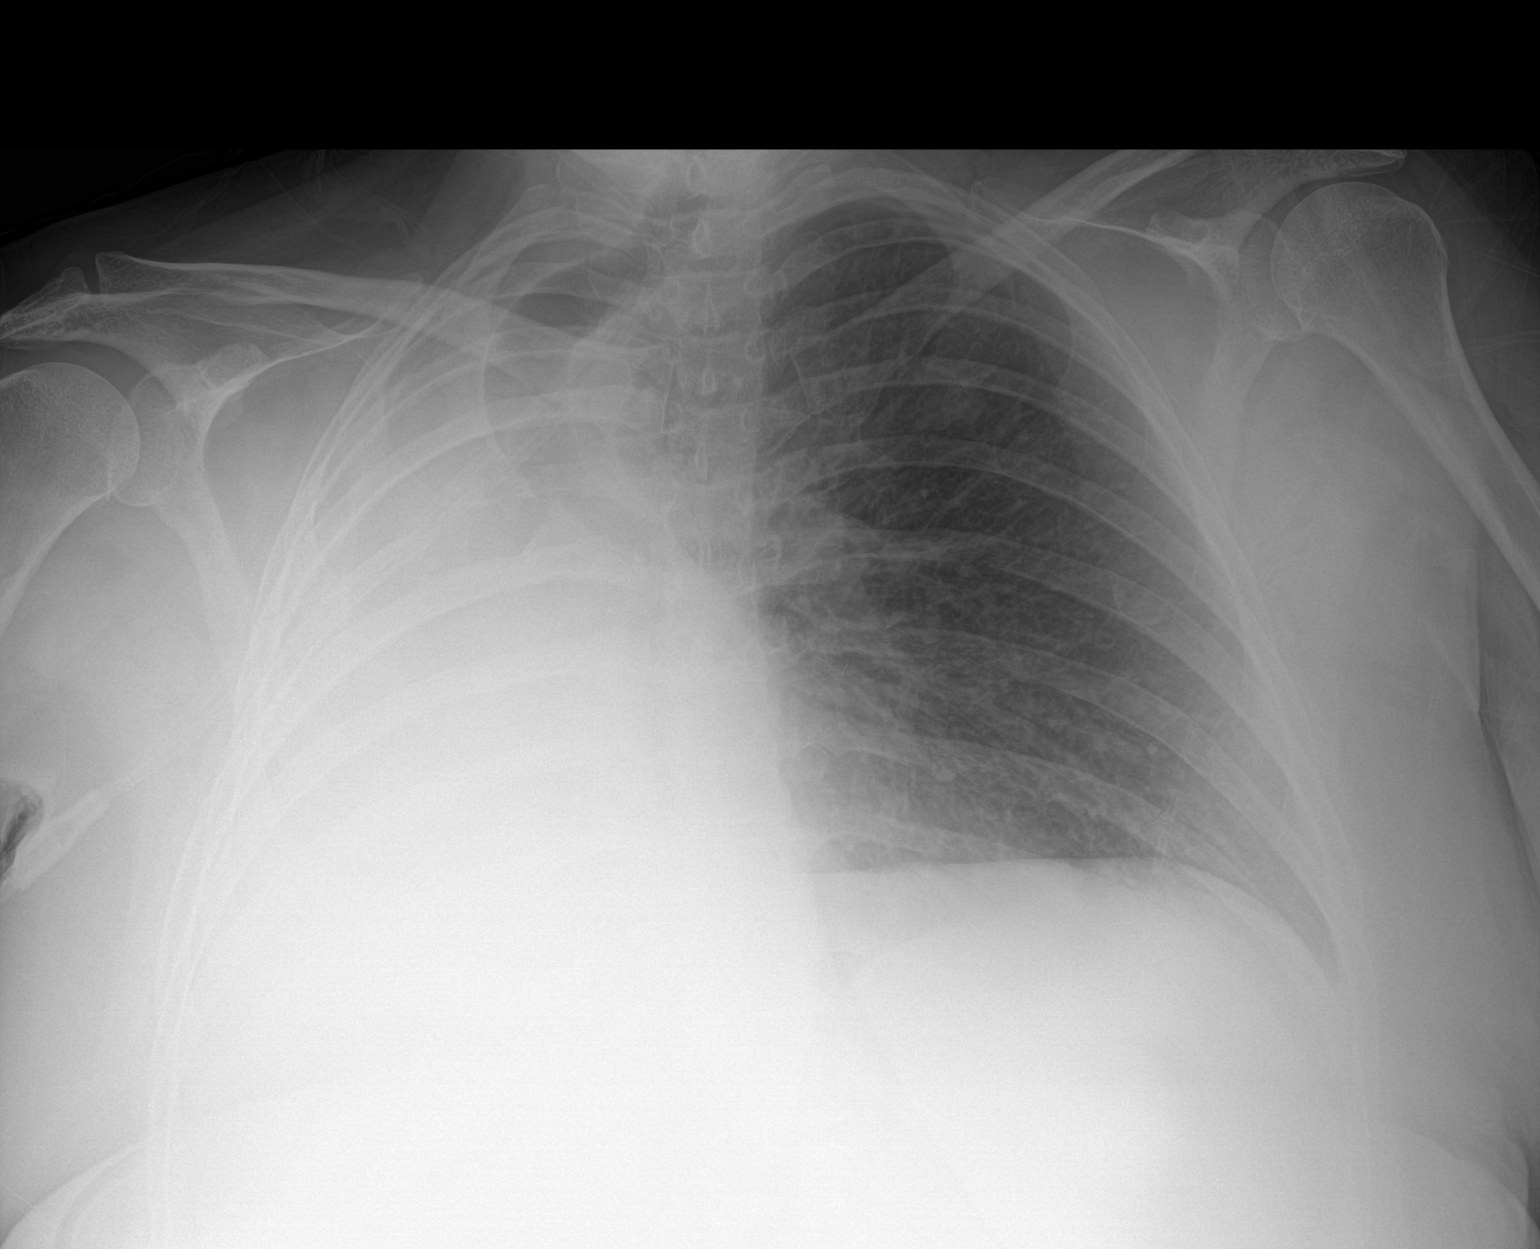

[1 of 1 positions shown; findings below may reference images not displayed]

FINDINGS: Right pneumonectomy with complete opacification of the right chest
unchanged. Volume loss with shift of the mediastinum to the right

Left lung well aerated and clear. Negative for heart failure or
pneumonia
IMPRESSION: Stable right pneumonectomy.  Left lung clear.

## 2022-12-24 DIAGNOSIS — I2699 Other pulmonary embolism without acute cor pulmonale: Secondary | ICD-10-CM | POA: Diagnosis not present

## 2022-12-24 DIAGNOSIS — C3481 Malignant neoplasm of overlapping sites of right bronchus and lung: Secondary | ICD-10-CM | POA: Diagnosis not present

## 2023-01-06 DIAGNOSIS — K5903 Drug induced constipation: Secondary | ICD-10-CM | POA: Diagnosis not present

## 2023-01-06 DIAGNOSIS — K219 Gastro-esophageal reflux disease without esophagitis: Secondary | ICD-10-CM | POA: Diagnosis not present

## 2023-01-06 DIAGNOSIS — Z86010 Personal history of colonic polyps: Secondary | ICD-10-CM | POA: Diagnosis not present

## 2023-01-22 DIAGNOSIS — C3481 Malignant neoplasm of overlapping sites of right bronchus and lung: Secondary | ICD-10-CM | POA: Diagnosis not present

## 2023-01-22 DIAGNOSIS — I2699 Other pulmonary embolism without acute cor pulmonale: Secondary | ICD-10-CM | POA: Diagnosis not present

## 2023-02-21 DIAGNOSIS — R920 Mammographic microcalcification found on diagnostic imaging of breast: Secondary | ICD-10-CM | POA: Diagnosis not present

## 2023-02-21 DIAGNOSIS — C50911 Malignant neoplasm of unspecified site of right female breast: Secondary | ICD-10-CM | POA: Diagnosis not present

## 2023-02-21 DIAGNOSIS — Z853 Personal history of malignant neoplasm of breast: Secondary | ICD-10-CM | POA: Diagnosis not present

## 2023-02-21 DIAGNOSIS — R921 Mammographic calcification found on diagnostic imaging of breast: Secondary | ICD-10-CM | POA: Diagnosis not present

## 2023-02-21 DIAGNOSIS — Z170 Estrogen receptor positive status [ER+]: Secondary | ICD-10-CM | POA: Diagnosis not present

## 2023-02-22 DIAGNOSIS — C3481 Malignant neoplasm of overlapping sites of right bronchus and lung: Secondary | ICD-10-CM | POA: Diagnosis not present

## 2023-02-22 DIAGNOSIS — I2699 Other pulmonary embolism without acute cor pulmonale: Secondary | ICD-10-CM | POA: Diagnosis not present

## 2023-02-23 DIAGNOSIS — D242 Benign neoplasm of left breast: Secondary | ICD-10-CM | POA: Diagnosis not present

## 2023-02-23 DIAGNOSIS — N6489 Other specified disorders of breast: Secondary | ICD-10-CM | POA: Diagnosis not present

## 2023-02-23 DIAGNOSIS — N6022 Fibroadenosis of left breast: Secondary | ICD-10-CM | POA: Diagnosis not present

## 2023-02-23 DIAGNOSIS — R921 Mammographic calcification found on diagnostic imaging of breast: Secondary | ICD-10-CM | POA: Diagnosis not present

## 2023-02-27 DIAGNOSIS — F4310 Post-traumatic stress disorder, unspecified: Secondary | ICD-10-CM | POA: Diagnosis not present

## 2023-02-27 DIAGNOSIS — F411 Generalized anxiety disorder: Secondary | ICD-10-CM | POA: Diagnosis not present

## 2023-02-27 DIAGNOSIS — F314 Bipolar disorder, current episode depressed, severe, without psychotic features: Secondary | ICD-10-CM | POA: Diagnosis not present

## 2023-02-27 DIAGNOSIS — F900 Attention-deficit hyperactivity disorder, predominantly inattentive type: Secondary | ICD-10-CM | POA: Diagnosis not present

## 2023-03-20 DIAGNOSIS — D242 Benign neoplasm of left breast: Secondary | ICD-10-CM | POA: Diagnosis not present

## 2023-03-24 DIAGNOSIS — I2699 Other pulmonary embolism without acute cor pulmonale: Secondary | ICD-10-CM | POA: Diagnosis not present

## 2023-03-24 DIAGNOSIS — C3481 Malignant neoplasm of overlapping sites of right bronchus and lung: Secondary | ICD-10-CM | POA: Diagnosis not present

## 2023-03-29 DIAGNOSIS — R300 Dysuria: Secondary | ICD-10-CM | POA: Diagnosis not present

## 2023-08-26 ENCOUNTER — Other Ambulatory Visit (HOSPITAL_BASED_OUTPATIENT_CLINIC_OR_DEPARTMENT_OTHER): Payer: Self-pay

## 2023-08-26 MED ORDER — ALPRAZOLAM 1 MG PO TABS
1.0000 mg | ORAL_TABLET | Freq: Three times a day (TID) | ORAL | 0 refills | Status: AC | PRN
  Filled 2023-08-26: qty 90, 30d supply, fill #0

## 2023-08-27 ENCOUNTER — Other Ambulatory Visit (HOSPITAL_BASED_OUTPATIENT_CLINIC_OR_DEPARTMENT_OTHER): Payer: Self-pay

## 2023-08-28 ENCOUNTER — Other Ambulatory Visit (HOSPITAL_BASED_OUTPATIENT_CLINIC_OR_DEPARTMENT_OTHER): Payer: Self-pay

## 2023-11-14 DIAGNOSIS — I499 Cardiac arrhythmia, unspecified: Secondary | ICD-10-CM | POA: Diagnosis not present
